# Patient Record
Sex: Female | Born: 1985 | Race: White | Hispanic: No | Marital: Single | Smoking: Never smoker
Health system: Southern US, Community
[De-identification: ages and names within clinical notes are randomized; demographics above are authoritative.]

## PROBLEM LIST (undated history)

## (undated) DIAGNOSIS — Z789 Other specified health status: Secondary | ICD-10-CM

## (undated) HISTORY — PX: NO PAST SURGERIES: SHX2092

## (undated) HISTORY — DX: Other specified health status: Z78.9

---

## 2011-02-17 NOTE — L&D Delivery Note (Signed)
Delivery Note At 6:20 AM a viable female was delivered via Vaginal, Spontaneous Delivery (Presentation: ; Left Occiput Anterior).  APGAR: 8, 9; weight 8 lb 11 oz (3940 g).   Placenta status: Intact, Spontaneous.  Cord: 3 vessels with the following complications: None.   Anesthesia: None  Episiotomy: None Lacerations: 1st degree perineal, hemostatic and well approximated, no repair needed Suture Repair: n/a Est. Blood Loss (mL): 250  Mom to postpartum.  Baby to nursery-stable.  Plans to breast/bottlefeed, desires Nexplanon.  Marge Duncans 10/25/2011, 7:36 AM

## 2011-07-29 ENCOUNTER — Encounter: Payer: Self-pay | Admitting: Family Medicine

## 2011-07-29 ENCOUNTER — Ambulatory Visit (INDEPENDENT_AMBULATORY_CARE_PROVIDER_SITE_OTHER): Payer: Self-pay | Admitting: Family Medicine

## 2011-07-29 VITALS — BP 95/61 | Temp 97.9°F | Wt 190.4 lb

## 2011-07-29 DIAGNOSIS — Z3490 Encounter for supervision of normal pregnancy, unspecified, unspecified trimester: Secondary | ICD-10-CM

## 2011-07-29 DIAGNOSIS — O0930 Supervision of pregnancy with insufficient antenatal care, unspecified trimester: Secondary | ICD-10-CM

## 2011-07-29 LAB — POCT URINALYSIS DIP (DEVICE)
Glucose, UA: NEGATIVE mg/dL
Hgb urine dipstick: NEGATIVE
Leukocytes, UA: NEGATIVE
Nitrite: NEGATIVE
Urobilinogen, UA: 0.2 mg/dL (ref 0.0–1.0)
pH: 6 (ref 5.0–8.0)

## 2011-07-29 NOTE — Progress Notes (Signed)
Pulse: 93 Pt is unsure of when her lmp was but went to health dept for upt and it is documented that lmp is 11/01/10, pt states that shes not sure if this is correct, but it may be. Has not had any ultrasounds. 1hr gtt today due at

## 2011-07-29 NOTE — Assessment & Plan Note (Signed)
Genetic Screen Late to care  Anatomic [place]   Glucose Screen   GBS   Feeding Preference Bottle  Contraception unsure  Circumcision

## 2011-07-29 NOTE — Progress Notes (Signed)
   Subjective:    Lindsay Tyler is a G2P1001 [redacted]w[redacted]d being seen today for her first obstetrical visit.  Her obstetrical history is significant for late to care. Patient does not intend to breast feed. Pregnancy history fully reviewed.  Patient reports no bleeding, no contractions, no cramping and no leaking.  Filed Vitals:   07/29/11 1038  BP: 95/61  Temp: 97.9 F (36.6 C)  Weight: 190 lb 6.4 oz (86.365 kg)    HISTORY: OB History    Grav Para Term Preterm Abortions TAB SAB Ect Mult Living   2 1 1  0 0 0 0 0 0 1     # Outc Date GA Lbr Len/2nd Wgt Sex Del Anes PTL Lv   1 TRM 2/09 [redacted]w[redacted]d   M SVD None No Yes   2 CUR              Past Medical History  Diagnosis Date  . No pertinent past medical history    Past Surgical History  Procedure Date  . No past surgeries    Family History  Problem Relation Age of Onset  . Diabetes Mother   . Hypertension Mother   . Heart disease Mother   . Cancer Mother     cervical     Exam    Uterus:     Pelvic Exam:    Perineum: No Hemorrhoids   Vulva: normal   Cervix: no cervical motion tenderness   Adnexa: not evaluated   Bony Pelvis: gynecoid  System:     Skin: normal coloration and turgor, no rashes    Neurologic: oriented, normal   Extremities: normal strength, tone, and muscle mass   HEENT PERRLA   Mouth/Teeth mucous membranes moist, pharynx normal without lesions   Neck supple and no masses   Cardiovascular: regular rate and rhythm, no murmurs or gallops   Respiratory:  appears well, vitals normal, no respiratory distress, acyanotic, normal RR, ear and throat exam is normal, neck free of mass or lymphadenopathy, chest clear, no wheezing, crepitations, rhonchi, normal symmetric air entry   Abdomen: soft, non-tender; bowel sounds normal; no masses,  no organomegaly   Urinary: urethral meatus normal      Assessment:    Pregnancy: G2P1001 Patient Active Problem List  Diagnosis  . Supervision of normal pregnancy        Plan:     Initial labs drawn. Prenatal vitamins. Problem list reviewed and updated. Genetic Screening - late to care  Ultrasound discussed; fetal survey: ordered.  Follow up in 1 weeks. 50% of 45 min visit spent on counseling and coordination of care.  GBS, gc/chlamydia done.  1hr GTT done ordered today.  As Pt [redacted] weeks EGA - will do pap at [facility] visit.   Lindsay Tyler 07/29/2011

## 2011-07-29 NOTE — Patient Instructions (Addendum)
Embarazo - Tercer trimestre (Pregnancy - Third Trimester) El tercer trimestre del embarazo (los ltimos 3 meses) es el perodo de cambios ms rpidos que atraviesan usted y el beb. El aumento de peso es ms rpido. El beb alcanza un largo de aproximadamente 50 cm (20 pulgadas) y pesa entre 2,700 y 4,500 kg (6 a 10 libras). El beb gana ms tejido graso y ya est listo para la vida fuera del cuerpo de la madre. Mientras estn en el interior, los bebs tienen perodos de sueo y vigilia, succionan el pulgar y tienen hipo. Quizs sienta pequeas contracciones del tero. Este es el falso trabajo de parto. Tambin se las conoce como contracciones de Braxton-Hicks. Es como una prctica del parto. Los problemas ms habituales de esta etapa del embarazo incluyen mayor dificultad para respirar, hinchazn de las manos y los pies por retencin de lquidos y la necesidad de orinar con ms frecuencia debido a que el tero y el beb presionan sobre la vejiga.  EXAMENES PRENATALES  Durante los exmenes prenatales, deber seguir realizando pruebas de sangre, segn avance el embarazo. Estas pruebas se realizan para controlar su salud y la del beb. Tambin se realizan anlisis de sangre para conocer los niveles de hemoglobina. La anemia (bajo nivel de hemoglobina) es frecuente durante el embarazo. Para prevenirla, se administran hierro y vitaminas. Tambin le harn nuevas pruebas para descartar la diabetes. Podrn repetirle algunas de las pruebas que le hicieron previamente.   En cada visita le medirn el tamao del tero. Es para asegurarse de que el beb se desarrolla correctamente.   Tambin en cada visita la pesarn. Esto se realiza para asegurarse de que aumenta de peso al ritmo indicado y que usted y su beb evolucionan normalmente.   En algunas ocasiones se realiza una ecografa para confirmar el correcto desarrollo y evolucin del beb. Esta prueba se realiza con ondas sonoras inofensivas para el beb, de modo  que el profesional pueda calcular con ms precisin la fecha del parto.   Discuta las posibilidades de la anestesia si necesita cesrea.  Algunas veces se realizan pruebas especializadas del lquido amnitico que rodea al beb. Esta prueba se denomina amniocentesis. El lquido amnitico se obtiene introduciendo una aguja en el abdomen (vientre). En ocasiones se lleva a cabo cerca del final del embarazo, si es necesario adelantar el parto. En este caso se realiza para asegurarse de que los pulmones del beb estn lo suficientemente maduros como para que pueda vivir fuera del tero. CAMBIOS QUE OCURREN EN EL TERCER TRIMESTRE DEL EMBARAZO Su organismo atravesar diferentes cambios durante el embarazo que varan de una persona a otra. Converse con el profesional que la asiste acerca los cambios que usted note y que la preocupen.  Durante el ltimo trimestre probablemente sienta un aumento del apetito. Es normal tener "antojos" de ciertas comidas. Esto vara de una persona a otra y de un embarazo a otro.   Podrn aparecer las primeras estras en las caderas, abdomen y mamas. Estos son cambios normales del cuerpo durante el embarazo. No existen medicamentos ni ejercicios que puedan prevenir estos cambios.   El estreimiento puede tratarse con un laxante o agregando fibra a su dieta. Beber grandes cantidades de lquidos, tomar fibras en forma de verduras, frutas y granos integrales es de gran ayuda.   Tambin es beneficioso practicar actividad fsica. Si ha sido una persona activa hasta el embarazo, podr continuar con la mayora de las actividades durante el mismo. Si ha sido menos activa, puede ser beneficioso   que comience con un programa de ejercicios, como realizar caminatas. Consulte con el profesional que la asiste antes de comenzar un programa de ejercicios.   Evite el consumo de cigarrillos, el alcohol, los medicamentos no prescritos y las "drogas de la calle" durante el embarazo. Estas sustancias  qumicas afectan la formacin y el desarrollo del beb. Evite estas sustancias durante todo el embarazo para asegurar el nacimiento de un beb sano.   Dolor de espalda, venas varicosas y hemorroides podran aparecer o empeorar.   Los movimientos del beb pueden ser ms bruscos y aparecer ms a menudo.   Puede que note dificultades para respirar facilmente.   El ombligo podra salrsele hacia afuera.   Puede segregar un lquido amarillento (calostro) de las mamas.   Puede segregar mucus con sangre. Esto normalmente ocurre unos pocos das a una semana antes de que comience el trabajo de parto.  INSTRUCCIONES PARA EL CUIDADO DOMICILIARIO  La mayor parte de los cuidados que se aconsejan son los mismos que los indicados para las primeras etapas del embarazo. Es importante que concurra a todas las citas con el profesional y siga sus instrucciones con respecto a los medicamentos que deba utilizar, a la actividad fsica y a la dieta.   Durante el embarazo debe obtener nutrientes para usted y para su beb. Consuma alimentos balanceados a intervalos regulares. Elija alimentos como carne, pescado, leche y otros productos lcteos descremados, verduras, frutas, panes integrales y cereales. El profesional le informar cul es el aumento de peso ideal.   Las relaciones sexuales pueden continuarse hasta casi el final del embarazo, si no se presentan otros problemas como prdida prematura (antes de tiempo) de lquido amnitico, hemorragia vaginal o dolor abdominal (en el vientre).   Realice actividad fsica todos los das, si no tiene restricciones. Consulte con el profesional que la asiste si no sabe con certeza si determinados ejercicios son seguros. El mayor aumento de peso se produce en los dos ltimos trimestres del embarazo.   Haga reposo con frecuencia, con las piernas elevadas, o segn lo necesite para evitar los calambres y el dolor de cintura.   Use un buen sostn o como los que se usan para hacer  deportes para aliviar la sensibilidad de las mamas. Tambin puede serle til si lo usa mientras duerme. Si pierde calostro, podr utilizar apsitos en el sostn.   No utilice la baera con agua caliente, baos turcos y saunas.   Colquese el cinturn de seguridad cuando conduzca. Este la proteger a usted y al beb en caso de accidente.   Evite comer carne cruda y el contacto con los utensilios y desperdicios de los gatos. Estos elementos contienen grmenes que pueden causar defectos de nacimiento en el beb.   Es fcil perder algo de orina durante el embarazo. Apretar y fortalecer los msculos de la pelvis la ayudar con este problema. Practique detener la miccin cuando est en el bao. Estos son los mismos msculos que necesita fortalecer. Son tambin los mismos msculos que utiliza cuando trata de evitar los gases. Puede practicar apretando estos msculos diez veces, y repetir esto tres veces por da aproximadamente. Una vez que conozca qu msculos debe contraer, no realice estos ejercicios durante la miccin. Puede favorecerle una infeccin si la orina vuelve hacia atrs.   Pida ayuda si tiene necesidades econmicas, de asesoramiento o nutricionales durante el embarazo. El profesional podr ayudarla con respecto a estas necesidades, o derivarla a otros especialistas.   Practique la ida hasta el hospital a modo   de prueba.   Tome clases prenatales junto con su pareja para comprender, practicar y hacer preguntas acerca del trabajo de parto y el nacimiento.   Prepare la habitacin del beb.   No viaje fuera de la ciudad a menos que sea absolutamente necesario y con el consejo del mdico.   Use slo zapatos bajos sin taco para tener un mejor equilibrio y prevenir cadas.  EL CONSUMO DE MEDICAMENTOS Y DROGAS DURANTE EL EMBARAZO  Contine tomando las vitaminas apropiadas para esta etapa tal como se le indic. Las vitaminas deben contener un miligramo de cido flico y deben suplementarse con  hierro. Guarde todas las vitaminas fuera del alcance de los nios. La ingestin de slo un par de vitaminas o comprimidos que contengan hierro pueden ocasionar la muerte en un beb o en un nio pequeo.   Evite el uso de medicamentos, inclusive los de venta libre, que no hayan sido prescritos o indicados por el profesional que la asiste. Algunos medicamentos pueden causar problemas fsicos al beb. Utilice los medicamentos de venta libre o de prescripcin para el dolor, el malestar o la fiebre, segn se lo indique el profesional que lo asiste. No utilice aspirina, ibuprofeno (Motrin, Advil, Nuprin) o naproxeno (Aleve) a menos que el profesional la autorice.   El alcohol se asocia a cierto nmero de defectos del nacimiento, incluido el sndrome de alcoholismo fetal. Debe evitar el consumo de alcohol en cualquiera de sus formas. El cigarrillo causa nacimientos prematuros y bebs de bajo peso al nacer. Las drogas de la calle son muy nocivas para el beb y estn absolutamente prohibidas. Un beb que nace de una madre adicta, ser adicto al nacer. Ese beb tendr los mismos sntomas de abstinencia que un adulto.   Infrmele al profesional si consume alguna droga.  SOLICITE ATENCIN MDICA SI: Tiene alguna preocupacin durante el embarazo. Es mejor que llame para formular las preguntas si no puede esperar hasta la prxima visita, que sentirse preocupada por ellas.  DECISIONES ACERCA DE LA CIRCUNCISIN Usted puede saber o no cul es el sexo de su beb. Si es un varn, ste es el momento de pensar acerca de la circuncisin. La circuncisin es la extirpacin del prepucio. Esta es la piel que cubre el extremo sensible del pene. No hay un motivo mdico que lo justifique. Generalmente la decisin se toma segn lo que sea popular en ese momento, o se basa en creencias religiosas. Podr conversar estos temas con el profesional que la asiste. SOLICITE ATENCIN MDICA DE INMEDIATO SI:  La temperatura oral se eleva  sin motivo por encima de 102 F (38.9 C) o segn le indique el profesional que la asiste.   Tiene una prdida de lquido por la vagina (canal de parto). Si sospecha una ruptura de las membranas, tmese la temperatura y llame al profesional para informarlo sobre esto.   Observa unas pequeas manchas, una hemorragia vaginal o elimina cogulos. Avsele al profesional acerca de la cantidad y de cuntos apsitos est utilizando.   Presenta un olor desagradable en la secrecin vaginal y observa un cambio en el color, de transparente a blanco.   Ha vomitado durante ms de 24 horas.   Presenta escalofros o fiebre.   Comienza a sentir falta de aire.   Siente ardor al orinar.   [place] o sube ms de 900 g (ms de 2 libras), o segn lo indicado por el profesional que la asiste. Observa que sbitamente se le hinchan el rostro, las manos, los pies o las   piernas.   Presenta dolor abdominal. Las molestias en el ligamento redondo son una causa benigna (no cancerosa) frecuente de dolor abdominal durante el embarazo, pero el profesional que la asiste deber evaluarlo.   Presenta dolor de cabeza intenso que no se alivia.   Si no siente los movimientos del beb durante ms de tres horas. Si piensa que el beb no se mueve tanto como lo haca habitualmente, coma algo que contenga azcar y recustese sobre el lado izquierdo durante una hora. El beb debe moverse al menos 4  5 veces por hora. Comunquese inmediatamente si el beb se mueve menos que lo indicado.   Se cae, se ve involucrada en un accidente automovilstico o sufre algn tipo de traumatismo.   En su hogar hay violencia mental o fsica.  Document Released: 11/12/2004 Document Revised: 01/22/2011 [employer] Patient Information 2012 [employer]. 

## 2011-07-30 LAB — OBSTETRIC PANEL
Eosinophils Absolute: 0 10*3/uL (ref 0.0–0.7)
Hemoglobin: 12.2 g/dL (ref 12.0–15.0)
Hepatitis B Surface Ag: NEGATIVE
Lymphs Abs: 1.4 10*3/uL (ref 0.7–4.0)
MCH: 29.3 pg (ref 26.0–34.0)
Monocytes Relative: 6 % (ref 3–12)
Neutro Abs: 6.2 10*3/uL (ref 1.7–7.7)
Neutrophils Relative %: 77 % (ref 43–77)
Platelets: 301 10*3/uL (ref 150–400)
RBC: 4.17 MIL/uL (ref 3.87–5.11)
WBC: 8.2 10*3/uL (ref 4.0–10.5)

## 2011-07-30 LAB — GLUCOSE TOLERANCE, 1 HOUR: Glucose, 1 Hour GTT: 109 mg/dL (ref 70–140)

## 2011-07-31 LAB — CULTURE, OB URINE
Colony Count: NO GROWTH
Organism ID, Bacteria: NO GROWTH

## 2011-08-04 ENCOUNTER — Ambulatory Visit (HOSPITAL_COMMUNITY)
Admission: RE | Admit: 2011-08-04 | Discharge: 2011-08-04 | Disposition: A | Discharge status: Home / Self Care | Payer: Self-pay | Source: Ambulatory Visit | Attending: Family Medicine | Admitting: Family Medicine

## 2011-08-04 DIAGNOSIS — Z1389 Encounter for screening for other disorder: Secondary | ICD-10-CM | POA: Insufficient documentation

## 2011-08-04 DIAGNOSIS — O358XX0 Maternal care for other (suspected) fetal abnormality and damage, not applicable or unspecified: Secondary | ICD-10-CM | POA: Insufficient documentation

## 2011-08-04 DIAGNOSIS — Z3490 Encounter for supervision of normal pregnancy, unspecified, unspecified trimester: Secondary | ICD-10-CM

## 2011-08-04 DIAGNOSIS — Z363 Encounter for antenatal screening for malformations: Secondary | ICD-10-CM | POA: Insufficient documentation

## 2011-08-05 ENCOUNTER — Ambulatory Visit (INDEPENDENT_AMBULATORY_CARE_PROVIDER_SITE_OTHER): Payer: Self-pay | Admitting: Advanced Practice Midwife

## 2011-08-05 VITALS — BP 94/55 | Temp 98.2°F | Wt 192.6 lb

## 2011-08-05 DIAGNOSIS — Z3490 Encounter for supervision of normal pregnancy, unspecified, unspecified trimester: Secondary | ICD-10-CM

## 2011-08-05 DIAGNOSIS — O0930 Supervision of pregnancy with insufficient antenatal care, unspecified trimester: Secondary | ICD-10-CM

## 2011-08-05 LAB — HEMOGLOBINOPATHY EVALUATION
Hemoglobin Other: 0 %
Hgb A2 Quant: 2.9 % (ref 2.2–3.2)
Hgb F Quant: 0 % (ref 0.0–2.0)

## 2011-08-05 LAB — POCT URINALYSIS DIP (DEVICE)
Bilirubin Urine: NEGATIVE
Ketones, ur: NEGATIVE mg/dL

## 2011-08-05 NOTE — Progress Notes (Signed)
Reviewed [place] from yesterday. Measurements show she was really 30.0 weeks yesterday. Pt is disappointed she has to wait longer. Reviewed lab results.

## 2011-08-05 NOTE — Patient Instructions (Addendum)
Embarazo - Tercer trimestre (Pregnancy - Third Trimester) El tercer trimestre del embarazo (los ltimos 3 meses) es el perodo de cambios ms rpidos que atraviesan usted y el beb. El aumento de peso es ms rpido. El beb alcanza un largo de aproximadamente 50 cm (20 pulgadas) y pesa entre 2,700 y 4,500 kg (6 a 10 libras). El beb gana ms tejido graso y ya est listo para la vida fuera del cuerpo de la madre. Mientras estn en el interior, los bebs tienen perodos de sueo y vigilia, succionan el pulgar y tienen hipo. Quizs sienta pequeas contracciones del tero. Este es el falso trabajo de parto. Tambin se las conoce como contracciones de Braxton-Hicks. Es como una prctica del parto. Los problemas ms habituales de esta etapa del embarazo incluyen mayor dificultad para respirar, hinchazn de las manos y los pies por retencin de lquidos y la necesidad de orinar con ms frecuencia debido a que el tero y el beb presionan sobre la vejiga.  EXAMENES PRENATALES  Durante los exmenes prenatales, deber seguir realizando pruebas de sangre, segn avance el embarazo. Estas pruebas se realizan para controlar su salud y la del beb. Tambin se realizan anlisis de sangre para conocer los niveles de hemoglobina. La anemia (bajo nivel de hemoglobina) es frecuente durante el embarazo. Para prevenirla, se administran hierro y vitaminas. Tambin le harn nuevas pruebas para descartar la diabetes. Podrn repetirle algunas de las pruebas que le hicieron previamente.   En cada visita le medirn el tamao del tero. Es para asegurarse de que el beb se desarrolla correctamente.   Tambin en cada visita la pesarn. Esto se realiza para asegurarse de que aumenta de peso al ritmo indicado y que usted y su beb evolucionan normalmente.   En algunas ocasiones se realiza una ecografa para confirmar el correcto desarrollo y evolucin del beb. Esta prueba se realiza con ondas sonoras inofensivas para el beb, de modo  que el profesional pueda calcular con ms precisin la fecha del parto.   Discuta las posibilidades de la anestesia si necesita cesrea.  Algunas veces se realizan pruebas especializadas del lquido amnitico que rodea al beb. Esta prueba se denomina amniocentesis. El lquido amnitico se obtiene introduciendo una aguja en el abdomen (vientre). En ocasiones se lleva a cabo cerca del final del embarazo, si es necesario adelantar el parto. En este caso se realiza para asegurarse de que los pulmones del beb estn lo suficientemente maduros como para que pueda vivir fuera del tero. CAMBIOS QUE OCURREN EN EL TERCER TRIMESTRE DEL EMBARAZO Su organismo atravesar diferentes cambios durante el embarazo que varan de una persona a otra. Converse con el profesional que la asiste acerca los cambios que usted note y que la preocupen.  Durante el ltimo trimestre probablemente sienta un aumento del apetito. Es normal tener "antojos" de ciertas comidas. Esto vara de una persona a otra y de un embarazo a otro.   Podrn aparecer las primeras estras en las caderas, abdomen y mamas. Estos son cambios normales del cuerpo durante el embarazo. No existen medicamentos ni ejercicios que puedan prevenir estos cambios.   El estreimiento puede tratarse con un laxante o agregando fibra a su dieta. Beber grandes cantidades de lquidos, tomar fibras en forma de verduras, frutas y granos integrales es de gran ayuda.   Tambin es beneficioso practicar actividad fsica. Si ha sido una persona activa hasta el embarazo, podr continuar con la mayora de las actividades durante el mismo. Si ha sido menos activa, puede ser beneficioso   que comience con un programa de ejercicios, como Pension scheme manager. Consulte con el profesional que la asiste antes de comenzar un programa de ejercicios.   Evite el consumo de cigarrillos, el alcohol, los medicamentos no prescritos y las "drogas de la calle" durante el [place]. Estas sustancias  qumicas afectan la formacin y el desarrollo del beb. Evite estas sustancias durante todo el embarazo para asegurar el nacimiento de un beb sano.   Dolor de espalda, venas varicosas y hemorroides podran aparecer o empeorar.   Los movimientos del beb pueden ser ms bruscos y aparecer ms a menudo.   Puede que note dificultades para respirar facilmente.   El ombligo podra salrsele hacia afuera.   Puede segregar un lquido amarillento (calostro) de las Universal.   Puede segregar mucus con sangre. Esto normalmente ocurre unos pocos das a una semana antes de que comience el [place].  [place] parte de los cuidados que se aconsejan son los mismos que los indicados para las primeras etapas del Media planner. Es importante que concurra a todas las citas con el profesional y siga sus instrucciones con Engineer, site a los medicamentos que deba Risk manager, a la actividad fsica y a Engineer, technical sales.   Durante el embarazo debe obtener nutrientes para usted y para su beb. Consuma alimentos balanceados a intervalos regulares. Elija alimentos como carne, pescado, [place] y otros productos lcteos descremados, verduras, frutas, panes integrales y cereales. El Conservation officer, nature cul es el aumento de peso ideal.   Las relaciones sexuales pueden continuarse hasta casi el final del embarazo, si no se presentan otros problemas como prdida prematura (antes de tiempo) de lquido amnitico, hemorragia vaginal o dolor abdominal (en el vientre).   [place], si no tiene restricciones. Consulte con el profesional que la asiste si no sabe con certeza si determinados ejercicios son seguros. El mayor aumento de peso se produce [place] ltimos trimestres del [place].   Haga reposo con frecuencia, con las piernas elevadas, o segn lo necesite para evitar los calambres y el dolor de cintura.   Use un buen sostn o como los que se usan para hacer  deportes para Public house manager la sensibilidad de las [place]. Tambin puede serle til si lo [place] mientras duerme. Si pierde Adult nurse, podr [employer].   No utilice la baera con agua caliente, baos turcos y saunas.   Colquese el cinturn de seguridad cuando conduzca. Este la proteger a usted y al beb en caso de accidente.   Evite comer carne cruda y el contacto con los utensilios y desperdicios de los gatos. Estos elementos contienen grmenes que pueden causar defectos de nacimiento en el beb.   Es fcil perder algo de orina durante el [place]. Apretar y Software engineer los msculos de la pelvis la ayudar con este problema. Practique detener la miccin cuando est en el bao. Estos son los mismos msculos que Advertising copywriter. Son [employer] mismos msculos que utiliza cuando trata de [employer] gases. Puede practicar apretando estos msculos [employer], y repetir esto tres veces por da aproximadamente. Una vez que conozca qu msculos debe contraer, no realice estos ejercicios durante la miccin. Puede favorecerle una infeccin si la orina vuelve hacia atrs.   Pida ayuda si tiene necesidades econmicas, de asesoramiento o nutricionales durante el [place]. El profesional podr ayudarla con respecto a estas necesidades, o derivarla a otros especialistas.   Practique la ida [employer] hospital a modo  de prueba.   Tome clases prenatales junto con su pareja para comprender, practicar y hacer preguntas acerca del [place] de parto y el nacimiento.   Prepare la habitacin del beb.   No viaje fuera de la ciudad a menos que sea absolutamente necesario y con el consejo del mdico.   Use slo zapatos bajos sin taco para tener un mejor equilibrio y prevenir cadas.  EL CONSUMO DE MEDICAMENTOS Y DROGAS DURANTE EL EMBARAZO  Contine tomando las vitaminas apropiadas para esta etapa tal como se le indic. Las vitaminas deben contener un miligramo de cido flico y deben suplementarse con  hierro. Guarde todas las vitaminas fuera del alcance de los nios. La ingestin de slo un par de vitaminas o comprimidos que contengan hierro pueden ocasionar la [employer] en un beb o en un nio pequeo.   Evite el uso de [place], inclusive los de venta [place], que no hayan sido prescritos o indicados por el profesional que la asiste. Algunos medicamentos pueden causar problemas fsicos al beb. Utilice los medicamentos de venta libre o de prescripcin para Conservation officer, historic buildings, Health and safety inspector o la [place], segn se lo indique el profesional que lo asiste. No utilice aspirina, ibuprofeno (Motrin, Advil, Nuprin) o naproxeno (Aleve) a menos que el profesional la autorice.   El alcohol se asocia a cierto nmero de defectos del nacimiento, incluido el sndrome de alcoholismo fetal. Debe evitar el consumo de alcohol en cualquiera de sus formas. El cigarrillo causa nacimientos prematuros y bebs de bajo peso al nacer. Las drogas de la calle son muy nocivas para el beb y estn absolutamente prohibidas. Un beb que nace de [employer], ser adicto al nacer. Ese beb tendr los mismos sntomas de abstinencia que un adulto.   Infrmele al profesional si consume alguna droga.  SOLICITE ATENCIN MDICA SI: Tiene alguna preocupacin Solicitor. Es mejor que llame para formular las preguntas si no puede esperar hasta la prxima visita, que sentirse preocupada por ellas.  [place] o no cul es el sexo de su beb. Si es un varn, ste es el momento de pensar acerca de la circuncisin. La circuncisin es la extirpacin del prepucio. Esta es la piel que cubre el extremo sensible del pene. No hay un motivo mdico que lo justifique. Generalmente la decisin se toma segn lo que sea popular en ese momento, o se basa en creencias religiosas. Podr conversar estos temas con el profesional que la asiste. SOLICITE ATENCIN MDICA DE INMEDIATO SI:  La temperatura oral se eleva  sin motivo por encima de 5 F (38.9 C) o segn le indique el profesional que la asiste.   Tiene una prdida de lquido por la vagina (canal de parto). Si sospecha una ruptura de las [place], tmese la temperatura y llame al profesional para informarlo sobre esto.   Observa unas pequeas manchas, una hemorragia vaginal o elimina cogulos. Avsele al profesional acerca de la cantidad y de cuntos apsitos est utilizando.   Presenta un olor desagradable en la secrecin vaginal y observa un cambio en el color, de transparente a blanco.   Ha vomitado durante ms de 24 horas.   Presenta escalofros o fiebre.   Comienza a sentir falta de [place].   Siente ardor al [place].   [place] o sube ms de 900 g (ms de 2 libras), o segn lo indicado por el profesional que la asiste. Observa que sbitamente se le [employer], las manos, los pies o las  piernas.   Presenta dolor abdominal. Las molestias en el ligamento redondo son Neomia Dear causa benigna (no cancerosa) frecuente de Engineer, mining abdominal durante el Psychiatrist, pero el profesional que la asiste deber evaluarlo.   Presenta dolor de cabeza intenso que no se [place].   Si no siente los movimientos del beb durante ms de tres horas. Si piensa que el beb no se mueve tanto como lo haca habitualmente, coma algo que Psychologist, clinical y [employer] lado izquierdo durante [place]. El beb debe moverse al menos 4  5 veces por hora. Comunquese inmediatamente si el beb se mueve menos que lo indicado.   Se cae, se ve involucrada en un accidente automovilstico o sufre algn tipo de traumatismo.   En su hogar hay violencia mental o fsica.  Document Released: 11/12/2004 Document Revised: 01/22/2011 [facility] Patient Information 2012 [place], [place].  Eleccin del mtodo anticonceptivo  (Contraception Choices) La anticoncepcin (control de la natalidad) es el uso de cualquier mtodo o dispositivo para Location manager. A continuacin se indican  algunos de esos mtodos.  MTODOS HORMONALES   Implante anticonceptivo. Es un tubo plstico delgado que contiene la hormona progesterona. No contiene estrgenos. El mdico inserta el tubo en la parte interna del brazo. El tubo puede Geneticist, molecular durante 3 aos. Despus de los 3 aos debe retirarse. El implante impide que los ovarios liberen vulos (ovulacin), espesa el moco cervical, lo que evita que los espermatozoides ingresen al tero y hace ms delgada la membrana que cubre el interior del tero.   Inyecciones de progesterona sola. Estas inyecciones se administran cada 3 meses para evitar el embarazo. La progesterona sinttica impide que los ovarios liberen vulos. Tambin hace que el moco cervical se espese y modifica el recubrimiento interno del tero. Esto hace ms difcil que los espermatozoides sobrevivan en el tero.   Pldoras anticonceptivas. Las pldoras anticonceptivas contienen estrgenos y Education officer, museum. Actan impidiendo que el vulo se forme en el ovario(ovulacin). Las pldoras anticonceptivas son recetadas por el mdico.Tambin se utilizan para tratar los perodos menstruales abundantes.   Minipldora. Este tipo de pldora anticonceptiva contiene slo hormona progesterona. Deben tomarse todos los [address]  [address] del mes y debe recetarlas el mdico.   Parches anticonceptivos. El parche contiene hormonas similares a las que contienen las pldoras anticonceptivas. Deben cambiarse una vez por semana y se utilizan bajo prescripcin mdica.   Anillo vaginal. Anillo vaginal contiene hormonas similares a las que contienen las pldoras anticonceptivas. Se deja colocado durante tres semanas, se lo retira durante 1 semana y luego se coloca uno nuevo. La paciente debe sentirse cmoda para insertar y retirar el anillo de la vagina.Es necesaria la receta del mdico.   Anticonceptivos de Associate Professor. Los anticonceptivos de emergencia son mtodos para evitar un embarazo despus de una relacin  sexual sin proteccin. Esta pldora puede tomarse inmediatamente despus de Child psychotherapist sexuales o hasta 5 [place] de haber tenido sexo sin proteccin. Es ms efectiva si se toma poco tiempo despus. Los anticonceptivos de emergencia estn disponibles sin prescripcin mdica. Consltelo con su farmacutico. No use los anticonceptivos de emergencia como nico mtodo anticonceptivo.  MTODOS DE BARRERA   Condn masculino. Es una vaina delgada (ltex o goma) que se [place] en el pene durante el acto sexual. Deri Fuelling con espermicida para aumentar la efectividad.   Condn femenino. Es una vaina blanda y floja que se adapta suavemente a la vagina antes de las relaciones sexuales.   Diafragma. Es Neomia Dear barrera de ltex redonda y [place]  que debe ser ajustada por un profesional. Se inserta en la vagina, junto con un gel espermicida. Debe insertarse antes de Management consultant. Debe dejar el diafragma colocado en la vagina durante 6 a 8 horas despus de la relacin sexual.   Capuchn cervical. Es una taza de ltex o plstico, redonda y [place] que cubre el cuello del tero y debe ser ajustada por un mdico. Puede dejarlo colocado en la vagina hasta 48 horas despus de las Clinical research associate.   Esponja. Es una pieza blanda y circular de espuma de poliuretano. Contiene un espermicida. Se inserta en la vagina despus de mojarla y antes de las [employer].   Espermicidas. Los espermicidas son qumicos que matan o bloquean el esperma y no lo dejan ingresar al cuello del tero y al tero. Vienen en forma de cremas, geles, supositorios, espuma o comprimidos. No es necesario tener Emergency planning/management officer. Se insertan en la vagina con un aplicador antes de Management consultant. El proceso debe repetirse cada vez que tiene relaciones sexuales.  ANTICONCEPTIVOS INTRAUTERINOS   Dispositivo intrauterino (DIU). Es un dispositivo en forma de T que se coloca en el tero durante el perodo menstrual, para Pharmacist, hospital. Hay dos tipos:   DIU de cobre. Este tipo de DIU est recubierto con un alambre de cobre y se inserta dentro del tero. El cobre hace que el tero y las trompas de Falopio produzcan un liquido que [employer] espermatozoides. Puede permanecer colocado durante 10 aos.   DIU hormonal. Este tipo de DIU contiene la hormona progestina (progesterona sinttica). La hormona espesa el moco cervical y evita que los espermatozoides ingresen al tero y tambin afina la membrana que cubre el tero para evitar la implantacin del vulo fertilizado. La hormona debilita o destruye los espermatozoides que ingresan al tero. Puede permanecer colocado durante 5 aos.  MTODOS ANTICONCEPTIVOS PERMANENTES   Ligadura de trompas en la mujer. La ligadura de trompas en la mujer se realiza sellando, atando u obstruyendo quirrgicamente las trompas de [place] lo que impide que el vulo descienda hacia el tero.   Esterilizacin masculina. Se realiza atando los conductos por los que pasan los espermatozoides (vasectoma).Esto impide que el esperma ingrese a la vagina durante el acto sexual. Luego del procedimiento, el hombre puede eyacular lquido (semen).  MTODOS DE PLANIFICACIN NATURAL   Planificacin familiar natural.  Consiste en no tener relaciones sexuales o usar un mtodo de barrera (condn, [place], capuchn cervical) en los [employer] la mujer podra quedar [place].   Mtodo calendario.  Consiste en el seguimiento de la duracin de cada ciclo menstrual y la identificacin de los perodos frtiles.   Mtodo de Occupational hygienist.  Consiste en evitar las relaciones sexuales durante la ovulacin.   Mtodo sintotrmico. Paramedic las relaciones sexuales en la poca en la que se est ovulando, utilizando un termmetro y tendiendo en cuenta los sntomas de la ovulacin.   Mtodo post-ovulacin. Consiste en planificar las relaciones sexuales para despus de haber ovulado.  Independientemente del tipo  o mtodo anticonceptivo que usted elija, es importante que use condones para protegerse contra las enfermedades de transmisin sexual (ETS). Hable con su mdico con respecto a qu mtodo anticonceptivo es el ms apropiado para usted.  Document Released: 02/02/2005 Document Revised: 01/22/2011 [facility] Patient Information 2012 [place], [place].

## 2011-08-05 NOTE — Progress Notes (Signed)
Pulse- 92   Edema- feet   Pain-lower abd

## 2011-08-11 ENCOUNTER — Encounter: Payer: Self-pay | Admitting: Advanced Practice Midwife

## 2011-08-11 ENCOUNTER — Encounter: Payer: Self-pay | Admitting: Family Medicine

## 2011-08-19 ENCOUNTER — Encounter: Payer: Self-pay | Admitting: *Deleted

## 2011-08-19 ENCOUNTER — Ambulatory Visit (INDEPENDENT_AMBULATORY_CARE_PROVIDER_SITE_OTHER): Payer: Self-pay | Admitting: Physician Assistant

## 2011-08-19 VITALS — BP 106/62 | Temp 97.1°F | Wt 192.9 lb

## 2011-08-19 DIAGNOSIS — Z3490 Encounter for supervision of normal pregnancy, unspecified, unspecified trimester: Secondary | ICD-10-CM

## 2011-08-19 DIAGNOSIS — N898 Other specified noninflammatory disorders of vagina: Secondary | ICD-10-CM

## 2011-08-19 DIAGNOSIS — O0930 Supervision of pregnancy with insufficient antenatal care, unspecified trimester: Secondary | ICD-10-CM

## 2011-08-19 LAB — POCT URINALYSIS DIP (DEVICE)
Protein, ur: NEGATIVE mg/dL
Urobilinogen, UA: 0.2 mg/dL (ref 0.0–1.0)

## 2011-08-19 NOTE — Progress Notes (Signed)
C/o gray/green vag d/c since prior to preg. Wet prep sent. + FM no s/s PTL reviewed new dating based on [place]. NL Glucola

## 2011-08-19 NOTE — Addendum Note (Signed)
Addended by: Doreen Salvage on: 08/19/2011 09:03 AM   Modules accepted: Orders

## 2011-08-19 NOTE — Patient Instructions (Signed)
Embarazo - Tercer trimestre (Pregnancy - Third Trimester) El tercer trimestre del embarazo (los ltimos 3 meses) es el perodo de cambios ms rpidos que atraviesan usted y el beb. El aumento de peso es ms rpido. El beb alcanza un largo de aproximadamente 50 cm (20 pulgadas) y pesa entre 2,700 y 4,500 kg (6 a 10 libras). El beb gana ms tejido graso y ya est listo para la vida fuera del cuerpo de la madre. Mientras estn en el interior, los bebs tienen perodos de sueo y vigilia, succionan el pulgar y tienen hipo. Quizs sienta pequeas contracciones del tero. Este es el falso trabajo de parto. Tambin se las conoce como contracciones de Braxton-Hicks. Es como una prctica del parto. Los problemas ms habituales de esta etapa del embarazo incluyen mayor dificultad para respirar, hinchazn de las manos y los pies por retencin de lquidos y la necesidad de orinar con ms frecuencia debido a que el tero y el beb presionan sobre la vejiga.  EXAMENES PRENATALES  Durante los exmenes prenatales, deber seguir realizando pruebas de sangre, segn avance el embarazo. Estas pruebas se realizan para controlar su salud y la del beb. Tambin se realizan anlisis de sangre para conocer los niveles de hemoglobina. La anemia (bajo nivel de hemoglobina) es frecuente durante el embarazo. Para prevenirla, se administran hierro y vitaminas. Tambin le harn nuevas pruebas para descartar la diabetes. Podrn repetirle algunas de las pruebas que le hicieron previamente.   En cada visita le medirn el tamao del tero. Es para asegurarse de que el beb se desarrolla correctamente.   Tambin en cada visita la pesarn. Esto se realiza para asegurarse de que aumenta de peso al ritmo indicado y que usted y su beb evolucionan normalmente.   En algunas ocasiones se realiza una ecografa para confirmar el correcto desarrollo y evolucin del beb. Esta prueba se realiza con ondas sonoras inofensivas para el beb, de modo  que el profesional pueda calcular con ms precisin la fecha del parto.   Discuta las posibilidades de la anestesia si necesita cesrea.  Algunas veces se realizan pruebas especializadas del lquido amnitico que rodea al beb. Esta prueba se denomina amniocentesis. El lquido amnitico se obtiene introduciendo una aguja en el abdomen (vientre). En ocasiones se lleva a cabo cerca del final del embarazo, si es necesario adelantar el parto. En este caso se realiza para asegurarse de que los pulmones del beb estn lo suficientemente maduros como para que pueda vivir fuera del tero. CAMBIOS QUE OCURREN EN EL TERCER TRIMESTRE DEL EMBARAZO Su organismo atravesar diferentes cambios durante el embarazo que varan de una persona a otra. Converse con el profesional que la asiste acerca los cambios que usted note y que la preocupen.  Durante el ltimo trimestre probablemente sienta un aumento del apetito. Es normal tener "antojos" de ciertas comidas. Esto vara de una persona a otra y de un embarazo a otro.   Podrn aparecer las primeras estras en las caderas, abdomen y mamas. Estos son cambios normales del cuerpo durante el embarazo. No existen medicamentos ni ejercicios que puedan prevenir estos cambios.   El estreimiento puede tratarse con un laxante o agregando fibra a su dieta. Beber grandes cantidades de lquidos, tomar fibras en forma de verduras, frutas y granos integrales es de gran ayuda.   Tambin es beneficioso practicar actividad fsica. Si ha sido una persona activa hasta el embarazo, podr continuar con la mayora de las actividades durante el mismo. Si ha sido menos activa, puede ser beneficioso   que comience con un programa de ejercicios, como realizar caminatas. Consulte con el profesional que la asiste antes de comenzar un programa de ejercicios.   Evite el consumo de cigarrillos, el alcohol, los medicamentos no prescritos y las "drogas de la calle" durante el embarazo. Estas sustancias  qumicas afectan la formacin y el desarrollo del beb. Evite estas sustancias durante todo el embarazo para asegurar el nacimiento de un beb sano.   Dolor de espalda, venas varicosas y hemorroides podran aparecer o empeorar.   Los movimientos del beb pueden ser ms bruscos y aparecer ms a menudo.   Puede que note dificultades para respirar facilmente.   El ombligo podra salrsele hacia afuera.   Puede segregar un lquido amarillento (calostro) de las mamas.   Puede segregar mucus con sangre. Esto normalmente ocurre unos pocos das a una semana antes de que comience el trabajo de parto.  INSTRUCCIONES PARA EL CUIDADO DOMICILIARIO  La mayor parte de los cuidados que se aconsejan son los mismos que los indicados para las primeras etapas del embarazo. Es importante que concurra a todas las citas con el profesional y siga sus instrucciones con respecto a los medicamentos que deba utilizar, a la actividad fsica y a la dieta.   Durante el embarazo debe obtener nutrientes para usted y para su beb. Consuma alimentos balanceados a intervalos regulares. Elija alimentos como carne, pescado, leche y otros productos lcteos descremados, verduras, frutas, panes integrales y cereales. El profesional le informar cul es el aumento de peso ideal.   Las relaciones sexuales pueden continuarse hasta casi el final del embarazo, si no se presentan otros problemas como prdida prematura (antes de tiempo) de lquido amnitico, hemorragia vaginal o dolor abdominal (en el vientre).   Realice actividad fsica todos los das, si no tiene restricciones. Consulte con el profesional que la asiste si no sabe con certeza si determinados ejercicios son seguros. El mayor aumento de peso se produce en los dos ltimos trimestres del embarazo.   Haga reposo con frecuencia, con las piernas elevadas, o segn lo necesite para evitar los calambres y el dolor de cintura.   Use un buen sostn o como los que se usan para hacer  deportes para aliviar la sensibilidad de las mamas. Tambin puede serle til si lo usa mientras duerme. Si pierde calostro, podr utilizar apsitos en el sostn.   No utilice la baera con agua caliente, baos turcos y saunas.   Colquese el cinturn de seguridad cuando conduzca. Este la proteger a usted y al beb en caso de accidente.   Evite comer carne cruda y el contacto con los utensilios y desperdicios de los gatos. Estos elementos contienen grmenes que pueden causar defectos de nacimiento en el beb.   Es fcil perder algo de orina durante el embarazo. Apretar y fortalecer los msculos de la pelvis la ayudar con este problema. Practique detener la miccin cuando est en el bao. Estos son los mismos msculos que necesita fortalecer. Son tambin los mismos msculos que utiliza cuando trata de evitar los gases. Puede practicar apretando estos msculos diez veces, y repetir esto tres veces por da aproximadamente. Una vez que conozca qu msculos debe contraer, no realice estos ejercicios durante la miccin. Puede favorecerle una infeccin si la orina vuelve hacia atrs.   Pida ayuda si tiene necesidades econmicas, de asesoramiento o nutricionales durante el embarazo. El profesional podr ayudarla con respecto a estas necesidades, o derivarla a otros especialistas.   Practique la ida hasta el hospital a modo   de prueba.   Tome clases prenatales junto con su pareja para comprender, practicar y hacer preguntas acerca del trabajo de parto y el nacimiento.   Prepare la habitacin del beb.   No viaje fuera de la ciudad a menos que sea absolutamente necesario y con el consejo del mdico.   Use slo zapatos bajos sin taco para tener un mejor equilibrio y prevenir cadas.  EL CONSUMO DE MEDICAMENTOS Y DROGAS DURANTE EL EMBARAZO  Contine tomando las vitaminas apropiadas para esta etapa tal como se le indic. Las vitaminas deben contener un miligramo de cido flico y deben suplementarse con  hierro. Guarde todas las vitaminas fuera del alcance de los nios. La ingestin de slo un par de vitaminas o comprimidos que contengan hierro pueden ocasionar la muerte en un beb o en un nio pequeo.   Evite el uso de medicamentos, inclusive los de venta libre, que no hayan sido prescritos o indicados por el profesional que la asiste. Algunos medicamentos pueden causar problemas fsicos al beb. Utilice los medicamentos de venta libre o de prescripcin para el dolor, el malestar o la fiebre, segn se lo indique el profesional que lo asiste. No utilice aspirina, ibuprofeno (Motrin, Advil, Nuprin) o naproxeno (Aleve) a menos que el profesional la autorice.   El alcohol se asocia a cierto nmero de defectos del nacimiento, incluido el sndrome de alcoholismo fetal. Debe evitar el consumo de alcohol en cualquiera de sus formas. El cigarrillo causa nacimientos prematuros y bebs de bajo peso al nacer. Las drogas de la calle son muy nocivas para el beb y estn absolutamente prohibidas. Un beb que nace de una madre adicta, ser adicto al nacer. Ese beb tendr los mismos sntomas de abstinencia que un adulto.   Infrmele al profesional si consume alguna droga.  SOLICITE ATENCIN MDICA SI: Tiene alguna preocupacin durante el embarazo. Es mejor que llame para formular las preguntas si no puede esperar hasta la prxima visita, que sentirse preocupada por ellas.  DECISIONES ACERCA DE LA CIRCUNCISIN Usted puede saber o no cul es el sexo de su beb. Si es un varn, ste es el momento de pensar acerca de la circuncisin. La circuncisin es la extirpacin del prepucio. Esta es la piel que cubre el extremo sensible del pene. No hay un motivo mdico que lo justifique. Generalmente la decisin se toma segn lo que sea popular en ese momento, o se basa en creencias religiosas. Podr conversar estos temas con el profesional que la asiste. SOLICITE ATENCIN MDICA DE INMEDIATO SI:  La temperatura oral se eleva  sin motivo por encima de 102 F (38.9 C) o segn le indique el profesional que la asiste.   Tiene una prdida de lquido por la vagina (canal de parto). Si sospecha una ruptura de las membranas, tmese la temperatura y llame al profesional para informarlo sobre esto.   Observa unas pequeas manchas, una hemorragia vaginal o elimina cogulos. Avsele al profesional acerca de la cantidad y de cuntos apsitos est utilizando.   Presenta un olor desagradable en la secrecin vaginal y observa un cambio en el color, de transparente a blanco.   Ha vomitado durante ms de 24 horas.   Presenta escalofros o fiebre.   Comienza a sentir falta de aire.   Siente ardor al orinar.   Baja o sube ms de 900 g (ms de 2 libras), o segn lo indicado por el profesional que la asiste. Observa que sbitamente se le hinchan el rostro, las manos, los pies o las   piernas.   Presenta dolor abdominal. Las molestias en el ligamento redondo son una causa benigna (no cancerosa) frecuente de dolor abdominal durante el embarazo, pero el profesional que la asiste deber evaluarlo.   Presenta dolor de cabeza intenso que no se alivia.   Si no siente los movimientos del beb durante ms de tres horas. Si piensa que el beb no se mueve tanto como lo haca habitualmente, coma algo que contenga azcar y recustese sobre el lado izquierdo durante una hora. El beb debe moverse al menos 4  5 veces por hora. Comunquese inmediatamente si el beb se mueve menos que lo indicado.   Se cae, se ve involucrada en un accidente automovilstico o sufre algn tipo de traumatismo.   En su hogar hay violencia mental o fsica.  Document Released: 11/12/2004 Document Revised: 01/22/2011 [employer] Patient Information 2012 [employer]. 

## 2011-08-19 NOTE — Progress Notes (Signed)
Pulse 91. Edema traces in feet. No c/o pain; no pressure. C/o vaginal d/c- grey in color with odor; no itch.

## 2011-08-20 ENCOUNTER — Other Ambulatory Visit: Payer: Self-pay | Admitting: Physician Assistant

## 2011-08-20 LAB — WET PREP, GENITAL: Yeast Wet Prep HPF POC: NONE SEEN

## 2011-08-20 MED ORDER — METRONIDAZOLE 500 MG PO TABS: 500.0000 mg | ORAL_TABLET | Freq: Two times a day (BID) | ORAL | Status: AC

## 2011-08-26 ENCOUNTER — Telehealth: Payer: Self-pay | Admitting: *Deleted

## 2011-08-26 NOTE — Telephone Encounter (Signed)
Called patient using [place] interpreter patients phone went straight to voicemail, left her a message requesting a callback.

## 2011-08-26 NOTE — Telephone Encounter (Signed)
Message copied by Mannie Stabile on Wed Aug 26, 2011  8:19 AM ------      Message from: Maylon Cos E      Created: Thu Aug 20, 2011 10:29 AM       Please notify pt of results. Rx flagyl sent to pharmacy

## 2011-08-27 NOTE — Telephone Encounter (Signed)
Called pt w/[employer] interpreter and left message that pt has medication prescribed for a vaginal infection. She should go to her pharmacy and pick up the medication.  Dosage instructions given.

## 2011-08-27 NOTE — Telephone Encounter (Signed)
Called pt with Spanish interpreter Raynelle Fanning and left message to return our call to the clinics.

## 2011-09-09 ENCOUNTER — Ambulatory Visit (INDEPENDENT_AMBULATORY_CARE_PROVIDER_SITE_OTHER): Payer: Self-pay | Admitting: Advanced Practice Midwife

## 2011-09-09 VITALS — BP 102/66 | Temp 97.1°F | Ht 61.25 in | Wt 196.0 lb

## 2011-09-09 DIAGNOSIS — O0930 Supervision of pregnancy with insufficient antenatal care, unspecified trimester: Secondary | ICD-10-CM

## 2011-09-09 LAB — POCT URINALYSIS DIP (DEVICE)
Glucose, UA: NEGATIVE mg/dL
Nitrite: NEGATIVE
Urobilinogen, UA: 0.2 mg/dL (ref 0.0–1.0)

## 2011-09-09 MED ORDER — GUAIFENESIN ER 600 MG PO TB12: 1200.0000 mg | ORAL_TABLET | Freq: Two times a day (BID) | ORAL | Status: DC

## 2011-09-09 MED ORDER — PSEUDOEPHEDRINE HCL 60 MG PO TABS: 60.0000 mg | ORAL_TABLET | ORAL | Status: AC | PRN

## 2011-09-09 NOTE — Patient Instructions (Signed)
Embarazo - Tercer trimestre (Pregnancy - Third Trimester) El tercer trimestre del embarazo (los ltimos 3 meses) es el perodo de cambios ms rpidos que atraviesan usted y el beb. El aumento de peso es ms rpido. El beb alcanza un largo de aproximadamente 50 cm (20 pulgadas) y pesa entre 2,700 y 4,500 kg (6 a 10 libras). El beb gana ms tejido graso y ya est listo para la vida fuera del cuerpo de la madre. Mientras estn en el interior, los bebs tienen perodos de sueo y vigilia, succionan el pulgar y tienen hipo. Quizs sienta pequeas contracciones del tero. Este es el falso trabajo de parto. Tambin se las conoce como contracciones de Braxton-Hicks. Es como una prctica del parto. Los problemas ms habituales de esta etapa del embarazo incluyen mayor dificultad para respirar, hinchazn de las manos y los pies por retencin de lquidos y la necesidad de orinar con ms frecuencia debido a que el tero y el beb presionan sobre la vejiga.  EXAMENES PRENATALES  Durante los exmenes prenatales, deber seguir realizando pruebas de sangre, segn avance el embarazo. Estas pruebas se realizan para controlar su salud y la del beb. Tambin se realizan anlisis de sangre para conocer los niveles de hemoglobina. La anemia (bajo nivel de hemoglobina) es frecuente durante el embarazo. Para prevenirla, se administran hierro y vitaminas. Tambin le harn nuevas pruebas para descartar la diabetes. Podrn repetirle algunas de las pruebas que le hicieron previamente.   En cada visita le medirn el tamao del tero. Es para asegurarse de que el beb se desarrolla correctamente.   Tambin en cada visita la pesarn. Esto se realiza para asegurarse de que aumenta de peso al ritmo indicado y que usted y su beb evolucionan normalmente.   En algunas ocasiones se realiza una ecografa para confirmar el correcto desarrollo y evolucin del beb. Esta prueba se realiza con ondas sonoras inofensivas para el beb, de modo  que el profesional pueda calcular con ms precisin la fecha del parto.   Discuta las posibilidades de la anestesia si necesita cesrea.  Algunas veces se realizan pruebas especializadas del lquido amnitico que rodea al beb. Esta prueba se denomina amniocentesis. El lquido amnitico se obtiene introduciendo una aguja en el abdomen (vientre). En ocasiones se lleva a cabo cerca del final del embarazo, si es necesario adelantar el parto. En este caso se realiza para asegurarse de que los pulmones del beb estn lo suficientemente maduros como para que pueda vivir fuera del tero. CAMBIOS QUE OCURREN EN EL TERCER TRIMESTRE DEL EMBARAZO Su organismo atravesar diferentes cambios durante el embarazo que varan de una persona a otra. Converse con el profesional que la asiste acerca los cambios que usted note y que la preocupen.  Durante el ltimo trimestre probablemente sienta un aumento del apetito. Es normal tener "antojos" de ciertas comidas. Esto vara de una persona a otra y de un embarazo a otro.   Podrn aparecer las primeras estras en las caderas, abdomen y mamas. Estos son cambios normales del cuerpo durante el embarazo. No existen medicamentos ni ejercicios que puedan prevenir estos cambios.   El estreimiento puede tratarse con un laxante o agregando fibra a su dieta. Beber grandes cantidades de lquidos, tomar fibras en forma de verduras, frutas y granos integrales es de gran ayuda.   Tambin es beneficioso practicar actividad fsica. Si ha sido una persona activa hasta el embarazo, podr continuar con la mayora de las actividades durante el mismo. Si ha sido menos activa, puede ser beneficioso   que comience con un programa de ejercicios, como realizar caminatas. Consulte con el profesional que la asiste antes de comenzar un programa de ejercicios.   Evite el consumo de cigarrillos, el alcohol, los medicamentos no prescritos y las "drogas de la calle" durante el embarazo. Estas sustancias  qumicas afectan la formacin y el desarrollo del beb. Evite estas sustancias durante todo el embarazo para asegurar el nacimiento de un beb sano.   Dolor de espalda, venas varicosas y hemorroides podran aparecer o empeorar.   Los movimientos del beb pueden ser ms bruscos y aparecer ms a menudo.   Puede que note dificultades para respirar facilmente.   El ombligo podra salrsele hacia afuera.   Puede segregar un lquido amarillento (calostro) de las mamas.   Puede segregar mucus con sangre. Esto normalmente ocurre unos pocos das a una semana antes de que comience el trabajo de parto.  INSTRUCCIONES PARA EL CUIDADO DOMICILIARIO  La mayor parte de los cuidados que se aconsejan son los mismos que los indicados para las primeras etapas del embarazo. Es importante que concurra a todas las citas con el profesional y siga sus instrucciones con respecto a los medicamentos que deba utilizar, a la actividad fsica y a la dieta.   Durante el embarazo debe obtener nutrientes para usted y para su beb. Consuma alimentos balanceados a intervalos regulares. Elija alimentos como carne, pescado, leche y otros productos lcteos descremados, verduras, frutas, panes integrales y cereales. El profesional le informar cul es el aumento de peso ideal.   Las relaciones sexuales pueden continuarse hasta casi el final del embarazo, si no se presentan otros problemas como prdida prematura (antes de tiempo) de lquido amnitico, hemorragia vaginal o dolor abdominal (en el vientre).   Realice actividad fsica todos los das, si no tiene restricciones. Consulte con el profesional que la asiste si no sabe con certeza si determinados ejercicios son seguros. El mayor aumento de peso se produce en los dos ltimos trimestres del embarazo.   Haga reposo con frecuencia, con las piernas elevadas, o segn lo necesite para evitar los calambres y el dolor de cintura.   Use un buen sostn o como los que se usan para hacer  deportes para aliviar la sensibilidad de las mamas. Tambin puede serle til si lo usa mientras duerme. Si pierde calostro, podr utilizar apsitos en el sostn.   No utilice la baera con agua caliente, baos turcos y saunas.   Colquese el cinturn de seguridad cuando conduzca. Este la proteger a usted y al beb en caso de accidente.   Evite comer carne cruda y el contacto con los utensilios y desperdicios de los gatos. Estos elementos contienen grmenes que pueden causar defectos de nacimiento en el beb.   Es fcil perder algo de orina durante el embarazo. Apretar y fortalecer los msculos de la pelvis la ayudar con este problema. Practique detener la miccin cuando est en el bao. Estos son los mismos msculos que necesita fortalecer. Son tambin los mismos msculos que utiliza cuando trata de evitar los gases. Puede practicar apretando estos msculos diez veces, y repetir esto tres veces por da aproximadamente. Una vez que conozca qu msculos debe contraer, no realice estos ejercicios durante la miccin. Puede favorecerle una infeccin si la orina vuelve hacia atrs.   Pida ayuda si tiene necesidades econmicas, de asesoramiento o nutricionales durante el embarazo. El profesional podr ayudarla con respecto a estas necesidades, o derivarla a otros especialistas.   Practique la ida hasta el hospital a modo   de prueba.   Tome clases prenatales junto con su pareja para comprender, practicar y hacer preguntas acerca del trabajo de parto y el nacimiento.   Prepare la habitacin del beb.   No viaje fuera de la ciudad a menos que sea absolutamente necesario y con el consejo del mdico.   Use slo zapatos bajos sin taco para tener un mejor equilibrio y prevenir cadas.  EL CONSUMO DE MEDICAMENTOS Y DROGAS DURANTE EL EMBARAZO  Contine tomando las vitaminas apropiadas para esta etapa tal como se le indic. Las vitaminas deben contener un miligramo de cido flico y deben suplementarse con  hierro. Guarde todas las vitaminas fuera del alcance de los nios. La ingestin de slo un par de vitaminas o comprimidos que contengan hierro pueden ocasionar la muerte en un beb o en un nio pequeo.   Evite el uso de medicamentos, inclusive los de venta libre, que no hayan sido prescritos o indicados por el profesional que la asiste. Algunos medicamentos pueden causar problemas fsicos al beb. Utilice los medicamentos de venta libre o de prescripcin para el dolor, el malestar o la fiebre, segn se lo indique el profesional que lo asiste. No utilice aspirina, ibuprofeno (Motrin, Advil, Nuprin) o naproxeno (Aleve) a menos que el profesional la autorice.   El alcohol se asocia a cierto nmero de defectos del nacimiento, incluido el sndrome de alcoholismo fetal. Debe evitar el consumo de alcohol en cualquiera de sus formas. El cigarrillo causa nacimientos prematuros y bebs de bajo peso al nacer. Las drogas de la calle son muy nocivas para el beb y estn absolutamente prohibidas. Un beb que nace de una madre adicta, ser adicto al nacer. Ese beb tendr los mismos sntomas de abstinencia que un adulto.   Infrmele al profesional si consume alguna droga.  SOLICITE ATENCIN MDICA SI: Tiene alguna preocupacin durante el embarazo. Es mejor que llame para formular las preguntas si no puede esperar hasta la prxima visita, que sentirse preocupada por ellas.  DECISIONES ACERCA DE LA CIRCUNCISIN Usted puede saber o no cul es el sexo de su beb. Si es un varn, ste es el momento de pensar acerca de la circuncisin. La circuncisin es la extirpacin del prepucio. Esta es la piel que cubre el extremo sensible del pene. No hay un motivo mdico que lo justifique. Generalmente la decisin se toma segn lo que sea popular en ese momento, o se basa en creencias religiosas. Podr conversar estos temas con el profesional que la asiste. SOLICITE ATENCIN MDICA DE INMEDIATO SI:  La temperatura oral se eleva  sin motivo por encima de 102 F (38.9 C) o segn le indique el profesional que la asiste.   Tiene una prdida de lquido por la vagina (canal de parto). Si sospecha una ruptura de las membranas, tmese la temperatura y llame al profesional para informarlo sobre esto.   Observa unas pequeas manchas, una hemorragia vaginal o elimina cogulos. Avsele al profesional acerca de la cantidad y de cuntos apsitos est utilizando.   Presenta un olor desagradable en la secrecin vaginal y observa un cambio en el color, de transparente a blanco.   Ha vomitado durante ms de 24 horas.   Presenta escalofros o fiebre.   Comienza a sentir falta de aire.   Siente ardor al orinar.   Baja o sube ms de 900 g (ms de 2 libras), o segn lo indicado por el profesional que la asiste. Observa que sbitamente se le hinchan el rostro, las manos, los pies o las   piernas.   Presenta dolor abdominal. Las molestias en el ligamento redondo son una causa benigna (no cancerosa) frecuente de dolor abdominal durante el embarazo, pero el profesional que la asiste deber evaluarlo.   Presenta dolor de cabeza intenso que no se alivia.   Si no siente los movimientos del beb durante ms de tres horas. Si piensa que el beb no se mueve tanto como lo haca habitualmente, coma algo que contenga azcar y recustese sobre el lado izquierdo durante una hora. El beb debe moverse al menos 4  5 veces por hora. Comunquese inmediatamente si el beb se mueve menos que lo indicado.   Se cae, se ve involucrada en un accidente automovilstico o sufre algn tipo de traumatismo.   En su hogar hay violencia mental o fsica.  Document Released: 11/12/2004 Document Revised: 01/22/2011 [employer] Patient Information 2012 [employer]. 

## 2011-09-09 NOTE — Progress Notes (Signed)
Pulse 89.  Edema trace in feet. C/o pain in lower back and lower abdomen; no pressure. Vaginal d/c stated as thin white; odor present, no itch. Stated did not take Flagyl because of dosage concerns.

## 2011-09-09 NOTE — Progress Notes (Signed)
C/o pain in lower back that radiates low and around to her pelvis. The pain started 2 weeks ago and is constant. No contractions, bleeding, or rush of fluid. Feels baby moving. Does have a discharge that has an odor to it. No vaginal itching. She was given flagyl 500mg  BID x 7 days but has not taken it because she is afraid it will hurt the baby. Trace protein today but BP normal and pt denies headaches or vision changes. She has had a cold for ~2 weeks and wonders if she can take any medicine for it. Gave rx for Mucinex and Sudafed. She can take Tylenol for the lower back pain. Pt reassured that she can safely take flagyl.

## 2011-09-09 NOTE — Progress Notes (Signed)
Pt seen and I agree with note. Will Rx Sudafed and Mucinex and have her use Tylenol for pain.

## 2011-09-11 ENCOUNTER — Encounter: Payer: Self-pay | Admitting: Advanced Practice Midwife

## 2011-09-16 ENCOUNTER — Ambulatory Visit (INDEPENDENT_AMBULATORY_CARE_PROVIDER_SITE_OTHER): Payer: Self-pay | Admitting: Family

## 2011-09-16 VITALS — BP 96/64 | Temp 97.1°F | Wt 198.5 lb

## 2011-09-16 DIAGNOSIS — O0930 Supervision of pregnancy with insufficient antenatal care, unspecified trimester: Secondary | ICD-10-CM

## 2011-09-16 LAB — POCT URINALYSIS DIP (DEVICE)
Bilirubin Urine: NEGATIVE
Glucose, UA: NEGATIVE mg/dL
Leukocytes, UA: NEGATIVE
Nitrite: NEGATIVE
pH: 6 (ref 5.0–8.0)

## 2011-09-16 LAB — OB RESULTS CONSOLE GBS: GBS: NEGATIVE

## 2011-09-16 NOTE — Progress Notes (Signed)
Pulse 88 Has swelling in her feet

## 2011-09-16 NOTE — Addendum Note (Signed)
Addended by: Freddi Starr on: 09/16/2011 10:20 AM   Modules accepted: Orders

## 2011-09-16 NOTE — Progress Notes (Signed)
No questions or concerns; GBS and GC/CT collected today; provided reassurance about feet swelling, elevate when possible.

## 2011-09-19 LAB — CULTURE, BETA STREP (GROUP B ONLY)

## 2011-09-20 ENCOUNTER — Encounter: Payer: Self-pay | Admitting: Family

## 2011-09-23 ENCOUNTER — Ambulatory Visit (INDEPENDENT_AMBULATORY_CARE_PROVIDER_SITE_OTHER): Payer: Self-pay | Admitting: Advanced Practice Midwife

## 2011-09-23 ENCOUNTER — Encounter: Payer: Self-pay | Admitting: Advanced Practice Midwife

## 2011-09-23 VITALS — BP 96/65 | Temp 97.7°F | Wt 200.2 lb

## 2011-09-23 DIAGNOSIS — O0930 Supervision of pregnancy with insufficient antenatal care, unspecified trimester: Secondary | ICD-10-CM

## 2011-09-23 LAB — POCT URINALYSIS DIP (DEVICE)
Protein, ur: NEGATIVE mg/dL
Specific Gravity, Urine: 1.01 (ref 1.005–1.030)
Urobilinogen, UA: 0.2 mg/dL (ref 0.0–1.0)

## 2011-09-23 NOTE — Progress Notes (Signed)
Doing well. Wanted to know results of testing>informed all negative.  Labor precautions reviewed. Reviewed PP care.

## 2011-09-23 NOTE — Patient Instructions (Signed)
Trabajo de parto y parto normal (Normal Labor and Delivery) En primer lugar, su mdico debe estar seguro de que usted est en trabajo de parto. Algunos signos son:  Puede haber eliminado el "tapn mucoso" antes que comience el trabajo de parto. Se trata de una pequea cantidad de mucus con sangre.   Tiene contracciones uterinas regulares.   El tiempo entre las contracciones se acorta.   Las molestias y el dolor se hacen gradualmente ms intensos.   El dolor se ubica principalmente en la espalda.   Los dolores empeoran al caminar.   El cuello del tero (la apertura del tero se hace ms delgada, comienza a borrarse, y se abre (se dilata).  Una vez que se encuentre en trabajo de parto y sea admitida en el hospital, el mdico har lo siguiente:  Un examen fsico completo.   Controlar sus signos vitales (presin arterial, pulso, temperatura y la frecuencia cardaca fetal).   Realizar un examen vaginal (usando un guante estril y lubricante para determinar:   La posicin (presentacin) del beb (ceflica [vertex] o nalgas primero).   El nivel (plano) de la cabeza del beb en el canal de parto.   El borramiento y dilatacin del cuello del tero.   Le rasurarn el vello pbico y le aplicarn una enema segn lo considere el mdico y las circunstancias.   Generalmente se coloca un monitor electrnico sobre el abdomen. El monitor sigue la duracin e intensidad de las contracciones, as como la frecuencia cardaca del beb.   Generalmente, el profesional inserta una va intravenosa en el brazo para administrarle agua azucarada. Esta es una medida de precaucin, de modo que puedan administrarle rpidamente medicamentos durante el trabajo de parto.  EL TRABAJO DE PARTO Y PARTO NORMALES SE DIVIDEN EN 3 ETAPAS: Primera etapa Comienzan las contracciones regulares y el cuello comienza a borrarse y dilatarse. Esta etapa puede durar entre 3 y 15 horas. El final de la primera etapa se considera  cuando el cuello est borrado en un 100% y se ha dilatado 10 cm. Le administrarn analgsicos por:  Inyeccin (morfina, demerol, etc.).   Anestesia regional (espinal, caudal o epidural, anestsicos colocados en diferentes regiones de la columna vertebral). Podrn administrarle medicamentos para el dolor en la regin paracervical, que consiste en la aplicacin de un anestsico inyectable en cada uno de los lados del cuello del tero.  La embarazada puede requerir un "parto natural" , es decir no recibir medicamentos o anestesia durante el trabajo de parto y el parto. Segunda etapa En este momento el beb baja a travs del canal de parto (vagina) y nace. Esto puede durar entre 1 y 4 horas. A medida que el beb asoma la cabeza por el canal de parto, podr sentir una sensacin similar a cuando mueve el intestino. Sentir el impulse de empujar con fuerza hasta que el nio salga. A medida que la cabecita baja, el mdico decidir si realiza una episiotoma (corte en el perineo y rea de la vagina) para evitar la ruptura de los tejidos). Luego del nacimiento del beb y la expulsin de la placenta, la episiotoma se sutura. En algunos casos se coloca a la madre una mscara con xido nitroso para facilitar la respiracin y aliviar el dolor. El final de la etapa 2 se produce cuando el beb ha salido completamente. Luego, cuando el cordn umbilical deja de pulsar, se pinza y se corta. Tercera etapa La tercera etapa comienza luego que el beb ha nacido y finaliza luego de la   expulsin de la placenta. Generalmente esto lleva entre 5 y 30 minutos. Luego de la expulsin de la placenta, le aplicarn un medicamento por va intravenosa para ayudar a contraer el tero y prevenir hemorragias. En la tercera etapa no hay dolor y generalmente no son necesarios los analgsicos. Si le han realizado una episiotoma, es el momento de repararla. Luego del parto, la mam es observada y controlada exhaustivamente durante 1  2 horas  para verificar que no hay sangrado en el post parto (hemorragias). Si pierde mucha sangre, le administrarn un medicamento para contraer el tero y detener la hemorragia. Document Released: 01/16/2008 Document Revised: 01/22/2011 [employer] Patient Information 2012 [employer]. 

## 2011-09-23 NOTE — Progress Notes (Signed)
Pulse 83 Patient reports some back and vaginal pressure and clear d/c Patient states she needs refill on PNV

## 2011-09-30 ENCOUNTER — Encounter: Payer: Self-pay | Admitting: Family

## 2011-10-07 ENCOUNTER — Encounter: Payer: Self-pay | Admitting: Obstetrics and Gynecology

## 2011-10-07 ENCOUNTER — Ambulatory Visit (INDEPENDENT_AMBULATORY_CARE_PROVIDER_SITE_OTHER): Payer: Self-pay | Admitting: Obstetrics and Gynecology

## 2011-10-07 VITALS — BP 101/67 | Temp 97.3°F | Wt 202.9 lb

## 2011-10-07 DIAGNOSIS — O0930 Supervision of pregnancy with insufficient antenatal care, unspecified trimester: Secondary | ICD-10-CM

## 2011-10-07 LAB — POCT URINALYSIS DIP (DEVICE)
Glucose, UA: NEGATIVE mg/dL
Hgb urine dipstick: NEGATIVE
Nitrite: NEGATIVE
Protein, ur: NEGATIVE mg/dL
Urobilinogen, UA: 0.2 mg/dL (ref 0.0–1.0)
pH: 6.5 (ref 5.0–8.0)

## 2011-10-07 NOTE — Progress Notes (Signed)
P = 83 Thin, clear discharge

## 2011-10-07 NOTE — Patient Instructions (Addendum)
Trabajo de parto y parto normal (Normal Labor and Delivery) En primer lugar, su mdico debe estar seguro de que usted est en trabajo de parto. Algunos signos son:  Puede haber eliminado el "tapn mucoso" antes que comience el trabajo de parto. Se trata de una pequea cantidad de mucus con sangre.   Tiene contracciones uterinas regulares.   El tiempo entre las contracciones se acorta.   Las molestias y el dolor se hacen gradualmente ms intensos.   El dolor se ubica principalmente en la espalda.   Los dolores empeoran al caminar.   El cuello del tero (la apertura del tero se hace ms delgada, comienza a borrarse, y se abre (se dilata).  Una vez que se encuentre en trabajo de parto y sea admitida en el hospital, el mdico har lo siguiente:  Un examen fsico completo.   Controlar sus signos vitales (presin arterial, pulso, temperatura y la frecuencia cardaca fetal).   Realizar un examen vaginal (usando un guante estril y lubricante para determinar:   La posicin (presentacin) del beb (ceflica [vertex] o nalgas primero).   El nivel (plano) de la cabeza del beb en el canal de parto.   El borramiento y dilatacin del cuello del tero.   Le rasurarn el vello pbico y le aplicarn una enema segn lo considere el mdico y las circunstancias.   Generalmente se coloca un monitor electrnico sobre el abdomen. El monitor sigue la duracin e intensidad de las contracciones, as como la frecuencia cardaca del beb.   Generalmente, el profesional inserta una va intravenosa en el brazo para administrarle agua azucarada. Esta es una medida de precaucin, de modo que puedan administrarle rpidamente medicamentos durante el trabajo de parto.  EL TRABAJO DE PARTO Y PARTO NORMALES SE DIVIDEN EN 3 ETAPAS: Primera etapa Comienzan las contracciones regulares y el cuello comienza a borrarse y dilatarse. Esta etapa puede durar entre 3 y 15 horas. El final de la primera etapa se considera  cuando el cuello est borrado en un 100% y se ha dilatado 10 cm. Le administrarn analgsicos por:  Inyeccin (morfina, demerol, etc.).   Anestesia regional (espinal, caudal o epidural, anestsicos colocados en diferentes regiones de la columna vertebral). Podrn administrarle medicamentos para el dolor en la regin paracervical, que consiste en la aplicacin de un anestsico inyectable en cada uno de los lados del cuello del tero.  La embarazada puede requerir un "parto natural" , es decir no recibir medicamentos o anestesia durante el trabajo de parto y el parto. Segunda etapa En este momento el beb baja a travs del canal de parto (vagina) y nace. Esto puede durar entre 1 y 4 horas. A medida que el beb asoma la cabeza por el canal de parto, podr sentir una sensacin similar a cuando mueve el intestino. Sentir el impulse de empujar con fuerza hasta que el nio salga. A medida que la cabecita baja, el mdico decidir si realiza una episiotoma (corte en el perineo y rea de la vagina) para evitar la ruptura de los tejidos). Luego del nacimiento del beb y la expulsin de la placenta, la episiotoma se sutura. En algunos casos se coloca a la madre una mscara con xido nitroso para facilitar la respiracin y aliviar el dolor. El final de la etapa 2 se produce cuando el beb ha salido completamente. Luego, cuando el cordn umbilical deja de pulsar, se pinza y se corta. Tercera etapa La tercera etapa comienza luego que el beb ha nacido y finaliza luego de la   expulsin de la placenta. Generalmente esto lleva entre 5 y 30 minutos. Luego de la expulsin de la placenta, le aplicarn un medicamento por va intravenosa para ayudar a contraer el tero y prevenir hemorragias. En la tercera etapa no hay dolor y generalmente no son necesarios los analgsicos. Si le han realizado una episiotoma, es el momento de repararla. Luego del parto, la mam es observada y controlada exhaustivamente durante 1  2 horas  para verificar que no hay sangrado en el post parto (hemorragias). Si pierde mucha sangre, le administrarn un medicamento para contraer el tero y detener la hemorragia. Document Released: 01/16/2008 Document Revised: 01/22/2011 [employer] Patient Information 2012 [employer]. 

## 2011-10-07 NOTE — Progress Notes (Signed)
Doing well.  Thin, clear discharge, no gush of fluid.  No bleeding.  Labor precautions reviewed.  Reviewed PP care.   Discussed options for contraception, given information about nexplanon.

## 2011-10-07 NOTE — Progress Notes (Signed)
Saw pt and agree wiith PA note. S/Sx labor.

## 2011-10-14 ENCOUNTER — Encounter: Payer: Self-pay | Admitting: Obstetrics and Gynecology

## 2011-10-14 ENCOUNTER — Ambulatory Visit (INDEPENDENT_AMBULATORY_CARE_PROVIDER_SITE_OTHER): Payer: Self-pay | Admitting: Obstetrics and Gynecology

## 2011-10-14 DIAGNOSIS — O0930 Supervision of pregnancy with insufficient antenatal care, unspecified trimester: Secondary | ICD-10-CM

## 2011-10-14 LAB — POCT URINALYSIS DIP (DEVICE)
Bilirubin Urine: NEGATIVE
Nitrite: NEGATIVE
Protein, ur: NEGATIVE mg/dL
Urobilinogen, UA: 0.2 mg/dL (ref 0.0–1.0)
pH: 6 (ref 5.0–8.0)

## 2011-10-14 NOTE — Patient Instructions (Signed)
Mtodo para contar los movimientos fetales (Fetal Movement Counts) Nombre de la paciente: __________________________________________________ Fecha probable de parto:____________________ En los embarazos de alto riesgo se recomienda contar las pataditas, pero tambin es una buena idea que lo hagan todas las embarazadas. Comience a contarlas a las 28 semanas de embarazo. Los movimientos fetales aumentan luego de una comida completa o de comer o beber algo dulce (el nivel de azcar en la sangre est ms alto). Tambin es importante beber gran cantidad de lquidos (hidratarse bien) antes de contar. Si se recuesta sobre el lado izquierdo mejorar la circulacin, o puede sentarse en una silla cmoda con los brazos sobre el abdomen y sin distracciones que la rodeen. CONTANDO  Trate de contar a la misma hora todos los das que lo haga.   Marque el da y la hora y vea cunto le lleva sentir 10 movimientos (patadas, agitaciones, sacudones, vueltas). Debe sentir al menos 10 movimientos en 2 horas. Probablemente sienta los 10 movimientos en menos de dos horas. Si no los siente, espere una hora y cuente nuevamente. Luego de algunos das tendr un patrn.   Debemos observar si hay cambios en el patrn o no hay suficientes pataditas en 2 horas. Le lleva ms tiempo contar los 10 movimientos?  SOLICITE ATENCIN MDICA SI:  Siente menos de 10 pataditas en 2 horas. Intntelo dos veces.   No siente movimientos durante 1 hora.   El patrn se modifica o le lleva ms tiempo cada da contar las 10 pataditas.   Siente que el beb no se mueve como lo hace habitualmente.  Fecha: ____________ Movimientos: ____________ Comienzo hora: ____________ Fin hora: ____________ Fecha: ____________ Movimientos: ____________ Comienzo hora: ____________ Fin hora: ____________ Fecha: ____________ Movimientos: ____________ Comienzo hora: ____________ Fin hora: ____________ Fecha: ____________ Movimientos: ____________ Comienzo hora:  ____________ Fin hora: ____________ Fecha: ____________ Movimientos: ____________ Comienzo hora: ____________ Fin hora: ____________ Fecha: ____________ Movimientos: ____________ Comienzo hora: ____________ Fin hora: ____________ Fecha: ____________ Movimientos: ____________ Comienzo hora: ____________ Fin hora: ____________  Fecha: ____________ Movimientos: ____________ Comienzo hora: ____________ Fin hora: ____________ Fecha: ____________ Movimientos: ____________ Comienzo hora: ____________ Fin hora: ____________ Fecha: ____________ Movimientos: ____________ Comienzo hora: ____________ Fin hora: ____________ Fecha: ____________ Movimientos: ____________ Comienzo hora: ____________ Fin hora: ____________ Fecha: ____________ Movimientos: ____________ Comienzo hora: ____________ Fin hora: ____________ Fecha: ____________ Movimientos: ____________ Comienzo hora: ____________ Fin hora: ____________ Fecha: ____________ Movimientos: ____________ Comienzo hora: ____________ Fin hora: ____________  Fecha: ____________ Movimientos: ____________ Comienzo hora: ____________ Fin hora: ____________ Fecha: ____________ Movimientos: ____________ Comienzo hora: ____________ Fin hora: ____________ Fecha: ____________ Movimientos: ____________ Comienzo hora: ____________ Fin hora: ____________ Fecha: ____________ Movimientos: ____________ Comienzo hora: ____________ Fin hora: ____________ Fecha: ____________ Movimientos: ____________ Comienzo hora: ____________ Fin hora: ____________ Fecha: ____________ Movimientos: ____________ Comienzo hora: ____________ Fin hora: ____________ Fecha: ____________ Movimientos: ____________ Comienzo hora: ____________ Fin hora: ____________  Fecha: ____________ Movimientos: ____________ Comienzo hora: ____________ Fin hora: ____________ Fecha: ____________ Movimientos: ____________ Comienzo hora: ____________ Fin hora: ____________ Fecha: ____________ Movimientos: ____________  Comienzo hora: ____________ Fin hora: ____________ Fecha: ____________ Movimientos: ____________ Comienzo hora: ____________ Fin hora: ____________ Fecha: ____________ Movimientos: ____________ Comienzo hora: ____________ Fin hora: ____________ Fecha: ____________ Movimientos: ____________ Comienzo hora: ____________ Fin hora: ____________ Fecha: ____________ Movimientos: ____________ Comienzo hora: ____________ Fin hora: ____________  Fecha: ____________ Movimientos: ____________ Comienzo hora: ____________ Fin hora: ____________ Fecha: ____________ Movimientos: ____________ Comienzo hora: ____________ Fin hora: ____________ Fecha: ____________ Movimientos: ____________ Comienzo hora: ____________ Fin hora: ____________ Fecha: ____________ Movimientos: ____________ Comienzo hora: ____________ Fin hora: ____________ Fecha: ____________ Movimientos:   ____________ Comienzo hora: ____________ Fin hora: ____________ Fecha: ____________ Movimientos: ____________ Comienzo hora: ____________ Fin hora: ____________ Fecha: ____________ Movimientos: ____________ Comienzo hora: ____________ Fin hora: ____________  Fecha: ____________ Movimientos: ____________ Comienzo hora: ____________ Fin hora: ____________ Fecha: ____________ Movimientos: ____________ Comienzo hora: ____________ Fin hora: ____________ Fecha: ____________ Movimientos: ____________ Comienzo hora: ____________ Fin hora: ____________ Fecha: ____________ Movimientos: ____________ Comienzo hora: ____________ Fin hora: ____________ Fecha: ____________ Movimientos: ____________ Comienzo hora: ____________ Fin hora: ____________ Fecha: ____________ Movimientos: ____________ Comienzo hora: ____________ Fin hora: ____________ Fecha: ____________ Movimientos: ____________ Comienzo hora: ____________ Fin hora: ____________  Fecha: ____________ Movimientos: ____________ Comienzo hora: ____________ Fin hora: ____________ Fecha: ____________ Movimientos:  ____________ Comienzo hora: ____________ Fin hora: ____________ Fecha: ____________ Movimientos: ____________ Comienzo hora: ____________ Fin hora: ____________ Fecha: ____________ Movimientos: ____________ Comienzo hora: ____________ Fin hora: ____________ Fecha: ____________ Movimientos: ____________ Comienzo hora: ____________ Fin hora: ____________ Fecha: ____________ Movimientos: ____________ Comienzo hora: ____________ Fin hora: ____________ Fecha: ____________ Movimientos: ____________ Comienzo hora: ____________ Fin hora: ____________  Fecha: ____________ Movimientos: ____________ Comienzo hora: ____________ Fin hora: ____________ Fecha: ____________ Movimientos: ____________ Comienzo hora: ____________ Fin hora: ____________ Fecha: ____________ Movimientos: ____________ Comienzo hora: ____________ Fin hora: ____________ Fecha: ____________ Movimientos: ____________ Comienzo hora: ____________ Fin hora: ____________ Fecha: ____________ Movimientos: ____________ Comienzo hora: ____________ Fin hora: ____________ Fecha: ____________ Movimientos: ____________ Comienzo hora: ____________ Fin hora: ____________ Fecha: ____________ Movimientos: ____________ Comienzo hora: ____________ Fin hora: ____________  Document Released: 05/12/2007 Document Revised: 01/22/2011 [employer] Patient Information 2012 [employer]. 

## 2011-10-14 NOTE — Progress Notes (Signed)
Dated by 30 wk scan so will start FATs in 2 days. S/SX labor and kick counts

## 2011-10-14 NOTE — Progress Notes (Signed)
P=87, c/o edema in feet, hands states"I feel like my whole body is swelling". Used Interpreter Dorita,.c/o pain in buttock area and pelvis

## 2011-10-16 ENCOUNTER — Ambulatory Visit (INDEPENDENT_AMBULATORY_CARE_PROVIDER_SITE_OTHER): Payer: Self-pay | Admitting: *Deleted

## 2011-10-16 VITALS — BP 94/67 | Wt 205.5 lb

## 2011-10-16 DIAGNOSIS — O480 Post-term pregnancy: Secondary | ICD-10-CM

## 2011-10-16 NOTE — Progress Notes (Signed)
P - 81 

## 2011-10-16 NOTE — Progress Notes (Signed)
NST reviewed and reactive.  

## 2011-10-18 NOTE — Progress Notes (Signed)
NST reviewed and reactive.  

## 2011-10-21 ENCOUNTER — Telehealth: Payer: Self-pay | Admitting: Obstetrics & Gynecology

## 2011-10-21 ENCOUNTER — Encounter: Payer: Self-pay | Admitting: Obstetrics and Gynecology

## 2011-10-21 ENCOUNTER — Other Ambulatory Visit: Payer: Self-pay

## 2011-10-21 NOTE — Telephone Encounter (Signed)
Patient was called with Interpreter Raynelle Fanning, and a message was left to call [phone] or [phone]

## 2011-10-22 ENCOUNTER — Ambulatory Visit (INDEPENDENT_AMBULATORY_CARE_PROVIDER_SITE_OTHER): Payer: Self-pay | Admitting: Obstetrics & Gynecology

## 2011-10-22 ENCOUNTER — Telehealth (HOSPITAL_COMMUNITY): Payer: Self-pay | Admitting: *Deleted

## 2011-10-22 VITALS — BP 106/65 | Temp 97.5°F | Wt 205.2 lb

## 2011-10-22 DIAGNOSIS — O0930 Supervision of pregnancy with insufficient antenatal care, unspecified trimester: Secondary | ICD-10-CM

## 2011-10-22 LAB — POCT URINALYSIS DIP (DEVICE)
Nitrite: NEGATIVE
Protein, ur: NEGATIVE mg/dL
Urobilinogen, UA: 0.2 mg/dL (ref 0.0–1.0)

## 2011-10-22 NOTE — Telephone Encounter (Signed)
Interpreter [phone] Preadmission screen

## 2011-10-22 NOTE — Progress Notes (Signed)
P - 97 

## 2011-10-22 NOTE — Progress Notes (Signed)
Needs induction for post dates.  Needs AFI.

## 2011-10-22 NOTE — Progress Notes (Signed)
Pt reports decr. FM since yesterday- she felt good FM during NST.  Normal patterns of FM discussed for this EGA. Pt instructed to return to hospital for sx of labor or decr. FM.  Pt voiced understanding.  Lindsay Tyler- interpreter present for questions and instructions.

## 2011-10-23 ENCOUNTER — Encounter (HOSPITAL_COMMUNITY): Payer: Self-pay

## 2011-10-23 ENCOUNTER — Inpatient Hospital Stay (HOSPITAL_COMMUNITY)
Admission: RE | Admit: 2011-10-23 | Discharge: 2011-10-26 | DRG: 775 | Disposition: A | Discharge status: Home / Self Care | Payer: Medicaid Other | Source: Ambulatory Visit | Attending: Obstetrics & Gynecology | Admitting: Obstetrics & Gynecology

## 2011-10-23 ENCOUNTER — Encounter (HOSPITAL_COMMUNITY): Payer: Self-pay | Admitting: *Deleted

## 2011-10-23 ENCOUNTER — Telehealth (HOSPITAL_COMMUNITY): Payer: Self-pay | Admitting: *Deleted

## 2011-10-23 DIAGNOSIS — O480 Post-term pregnancy: Principal | ICD-10-CM | POA: Diagnosis present

## 2011-10-23 LAB — CBC
HCT: 33.8 % — ABNORMAL LOW (ref 36.0–46.0)
Hemoglobin: 11.3 g/dL — ABNORMAL LOW (ref 12.0–15.0)
MCH: 27.4 pg (ref 26.0–34.0)
MCHC: 33.4 g/dL (ref 30.0–36.0)
MCV: 81.8 fL (ref 78.0–100.0)
RBC: 4.13 MIL/uL (ref 3.87–5.11)

## 2011-10-23 MED ORDER — IBUPROFEN 600 MG PO TABS
600.0000 mg | ORAL_TABLET | Freq: Four times a day (QID) | ORAL | Status: DC | PRN
  Administered 2011-10-25: 600 mg via ORAL
  Filled 2011-10-23: qty 1

## 2011-10-23 MED ORDER — TERBUTALINE SULFATE 1 MG/ML IJ SOLN: 0.2500 mg | Freq: Once | INTRAMUSCULAR | Status: AC | PRN

## 2011-10-23 MED ORDER — ONDANSETRON HCL 4 MG/2ML IJ SOLN: 4.0000 mg | Freq: Four times a day (QID) | INTRAMUSCULAR | Status: DC | PRN

## 2011-10-23 MED ORDER — OXYTOCIN 40 UNITS IN LACTATED RINGERS INFUSION - SIMPLE MED
62.5000 mL/h | Freq: Once | INTRAVENOUS | Status: AC
  Administered 2011-10-25: 62.5 mL/h via INTRAVENOUS

## 2011-10-23 MED ORDER — LIDOCAINE HCL (PF) 1 % IJ SOLN
30.0000 mL | INTRAMUSCULAR | Status: DC | PRN
  Filled 2011-10-23: qty 30

## 2011-10-23 MED ORDER — LACTATED RINGERS IV SOLN
INTRAVENOUS | Status: DC
  Administered 2011-10-23 – 2011-10-25 (×4): via INTRAVENOUS

## 2011-10-23 MED ORDER — ACETAMINOPHEN 325 MG PO TABS: 650.0000 mg | ORAL_TABLET | ORAL | Status: DC | PRN

## 2011-10-23 MED ORDER — OXYTOCIN BOLUS FROM INFUSION
500.0000 mL | Freq: Once | INTRAVENOUS | Status: DC
  Filled 2011-10-23: qty 500

## 2011-10-23 MED ORDER — LACTATED RINGERS IV SOLN: 500.0000 mL | INTRAVENOUS | Status: DC | PRN

## 2011-10-23 MED ORDER — OXYCODONE-ACETAMINOPHEN 5-325 MG PO TABS
1.0000 | ORAL_TABLET | ORAL | Status: DC | PRN
  Administered 2011-10-25: 1 via ORAL
  Filled 2011-10-23: qty 1

## 2011-10-23 MED ORDER — FLEET ENEMA 7-19 GM/118ML RE ENEM: 1.0000 | ENEMA | RECTAL | Status: DC | PRN

## 2011-10-23 MED ORDER — OXYTOCIN 40 UNITS IN LACTATED RINGERS INFUSION - SIMPLE MED
1.0000 m[IU]/min | INTRAVENOUS | Status: DC
  Administered 2011-10-23: 2 m[IU]/min via INTRAVENOUS
  Filled 2011-10-23: qty 1000

## 2011-10-23 MED ORDER — CITRIC ACID-SODIUM CITRATE 334-500 MG/5ML PO SOLN: 30.0000 mL | ORAL | Status: DC | PRN

## 2011-10-23 MED ORDER — FENTANYL CITRATE 0.05 MG/ML IJ SOLN
100.0000 ug | INTRAMUSCULAR | Status: DC | PRN
  Administered 2011-10-25 (×2): 100 ug via INTRAVENOUS
  Filled 2011-10-23 (×2): qty 2

## 2011-10-23 NOTE — Telephone Encounter (Signed)
Preadmission screen interperter number [phone]

## 2011-10-23 NOTE — H&P (Signed)
Lindsay Tyler is a 26 y.o. G62P1001 Hispanic female presenting for IOL for post-term pregnancy. Maternal Medical History:  Fetal activity: Perceived fetal activity is normal.   Last perceived fetal movement was within the past hour.      OB History    Grav Para Term Preterm Abortions TAB SAB Ect Mult Living   2 1 1  0 0 0 0 0 0 1     Past Medical History  Diagnosis Date  . No pertinent past medical history    Past Surgical History  Procedure Date  . No past surgeries    Family History: family history includes Cancer in her mother; Diabetes in her mother; Heart disease in her mother; and Hypertension in her mother. Social History:  reports that she has never smoked. She has never used smokeless tobacco. She reports that she does not drink alcohol or use illicit drugs.   Prenatal Transfer Tool  Maternal Diabetes: No Genetic Screening: late to care- Declined Maternal Ultrasounds/Referrals: Normal Fetal Ultrasounds or other Referrals:  None Maternal Substance Abuse:  No Significant Maternal Medications:  None Significant Maternal Lab Results:  None Other Comments:  None  Review of Systems  Constitutional: Negative.   HENT: Negative.   Eyes: Negative.   Respiratory: Negative.   Cardiovascular: Negative.   Gastrointestinal: Negative.   Genitourinary: Negative.   Skin: Negative.   Neurological: Negative.    Dilation: 1.5 Effacement (%): 60 Station: -3 Blood pressure 120/73, pulse 88, temperature 98.3 F (36.8 C), temperature source Oral, resp. rate 20, height 5' 1.25" (1.556 m), weight 92.987 kg (205 lb), last menstrual period 11/01/2010.  Maternal Exam:  Uterine Assessment: Contraction strength is mild.  Contraction frequency is rare.   Abdomen: Patient reports no abdominal tenderness. Estimated fetal weight is 7.5 lbs.   Fetal presentation: vertex  Introitus: Normal vulva. Normal vagina.    Fetal Exam Fetal Monitor Review: Mode: ultrasound.   Baseline rate:  145.  Variability: moderate (6-25 bpm).   Pattern: accelerations present and no decelerations.    Fetal State Assessment: Category I - tracings are normal.     Physical Exam  Constitutional: She is oriented to person, place, and time. She appears well-developed.  HENT:  Head: Normocephalic.  Neck: Normal range of motion.  Cardiovascular: Normal rate.   Respiratory: Effort normal.  GI: Soft.  Genitourinary: Vagina normal.  Musculoskeletal: Normal range of motion.  Neurological: She is alert and oriented to person, place, and time.  Skin: Skin is warm and dry.  Psychiatric: She has a normal mood and affect.    Dilation: 1.5 Effacement (%): 60 Station: -3 Presentation: Vertex  Prenatal labs: ABO, Rh: O/POS/-- (06/12 1151) Antibody: NEG (06/12 1151) Rubella: 168.9 (06/12 1151) RPR: NON REAC (06/12 1151)  HBsAg: NEGATIVE (06/12 1151)  HIV: NON REACTIVE (06/12 1151)  GBS: Negative (07/31 0000)  1hr [id number] Assessment/Plan: IUP@[redacted]w[redacted]d  Post-term pregnancy  Admit, efm per unit policy, pitocin for induction, epidural prn  [name] 10/23/2011, 9:06 PM

## 2011-10-24 LAB — ABO/RH: ABO/RH(D): O POS

## 2011-10-24 MED ORDER — TERBUTALINE SULFATE 1 MG/ML IJ SOLN: 0.2500 mg | Freq: Once | INTRAMUSCULAR | Status: AC | PRN

## 2011-10-24 MED ORDER — MISOPROSTOL 25 MCG QUARTER TABLET
25.0000 ug | ORAL_TABLET | ORAL | Status: DC | PRN
  Administered 2011-10-24 (×2): 25 ug via VAGINAL
  Filled 2011-10-24 (×4): qty 0.25

## 2011-10-24 NOTE — Progress Notes (Signed)
Lindsay Tyler is a 26 y.o. G2P1001 at [redacted]w[redacted]d admitted for induction of labor due to Post dates.  Subjective: Comfortable w/o complaints  Objective: BP 118/72  Pulse 76  Temp 98 F (36.7 C) (Oral)  Resp 18  Ht 5' 1.25" (1.556 m)  Wt 92.987 kg (205 lb)  BMI 38.42 kg/m2  LMP 11/01/2010      FHT:  FHR: 140 bpm, variability: moderate,  accelerations:  Present,  decelerations:  Absent UC:   irregular, every 3-6 minutes, not tracing well d/t maternal position SVE:   Dilation: 2.5 Effacement (%): 50 Station: -3 Exam by:: Cleone Slim RN   Labs: Lab Results  Component Value Date   WBC 8.8 10/23/2011   HGB 11.3* 10/23/2011   HCT 33.8* 10/23/2011   MCV 81.8 10/23/2011   PLT 269 10/23/2011    Assessment / Plan: IOL d/t postdates, s/p 2nd dose of cytotec  Labor: not yet Preeclampsia:  n/a Fetal Wellbeing:  Category I Pain Control:  n/a I/D:  n/a Anticipated MOD:  NSVD  Marge Duncans 10/24/2011, 7:11 PM

## 2011-10-24 NOTE — Progress Notes (Signed)
Shawna Clamp SCNM

## 2011-10-24 NOTE — Progress Notes (Signed)
Lindsay Tyler is a 26 y.o. G2P1001 at [redacted]w[redacted]d  admitted for induction of labor due to Post dates. Interpreter in room for exam  Subjective: Feeling slightly uncomfortable with uc's.  Reports being hungry.  Objective: BP 98/54  Pulse 73  Temp 97.9 F (36.6 C) (Oral)  Resp 18  Ht 5' 1.25" (1.556 m)  Wt 92.987 kg (205 lb)  BMI 38.42 kg/m2  LMP 11/01/2010      FHT:  FHR: 130 bpm, variability: moderate,  accelerations:  Present,  decelerations:  Absent UC:   regular, every 2-3 minutes SVE:   2/50/-3, very posterior, balottable (my exam)  Labs: Lab Results  Component Value Date   WBC 8.8 10/23/2011   HGB 11.3* 10/23/2011   HCT 33.8* 10/23/2011   MCV 81.8 10/23/2011   PLT 269 10/23/2011   On 30mu/min pitocin now, has been on pitocin x 14hrs   Assessment / Plan: IOL for postdates  Labor: IOL, on 43mu/min pitocin, has been on pitocin x 14hrs without progressive cervical change Preeclampsia:  n/a Fetal Wellbeing:  Category I Pain Control:  none I/D:  n/a Anticipated MOD:  NSVD  Will turn pitocin off for now.  Allow pt to eat light lunch.  Begin cytotec per protocol.   Marge Duncans 10/24/2011, 11:44 AM

## 2011-10-24 NOTE — Progress Notes (Signed)
Lindsay Tyler is a 26 y.o. G2P1001 at [redacted]w[redacted]d  admitted for induction of labor due to Post dates.   Subjective: Comfortable without pain  Objective: BP 110/69  Pulse 68  Temp 98 F (36.7 C) (Oral)  Resp 20  Ht 5' 1.25" (1.556 m)  Wt 92.987 kg (205 lb)  BMI 38.42 kg/m2  LMP 11/01/2010      FHT:  FHR: 125 bpm, variability: moderate,  accelerations:  Present,  decelerations:  Absent UC:   regular, every 2-4 minutes SVE:   3/60/-3 by Denyse Amass, SNM  Labs: Lab Results  Component Value Date   WBC 8.8 10/23/2011   HGB 11.3* 10/23/2011   HCT 33.8* 10/23/2011   MCV 81.8 10/23/2011   PLT 269 10/23/2011   On 71mu/min pitocin  Assessment / Plan: IOL for postdates, on 30mu/min pitocin  Labor: IOL, no labor at this time Preeclampsia:  n/a Fetal Wellbeing:  Category I Pain Control:  n/a I/D:  n/a Anticipated MOD:  NSVD  Marge Duncans 10/24/2011, 11:49 AM

## 2011-10-24 NOTE — Progress Notes (Signed)
Lindsay Tyler is a 26 y.o. G2P1001 at [redacted]w[redacted]d admitted for induction of labor due to Post dates.   Subjective: Sleeping  Objective: BP 104/54  Pulse 81  Temp 98.7 F (37.1 C) (Oral)  Resp 18  Ht 5' 1.25" (1.556 m)  Wt 92.987 kg (205 lb)  BMI 38.42 kg/m2  LMP 11/01/2010      FHT:  FHR: 150 bpm, variability: moderate,  accelerations:  Present,  decelerations:  Absent UC:   irregular, every 4-5 minutes, not tracing well d/t maternal position SVE:   Dilation: 2.5 Effacement (%): 50 Station: -3 Exam by:: Cleone Slim RN   Labs: Lab Results  Component Value Date   WBC 8.8 10/23/2011   HGB 11.3* 10/23/2011   HCT 33.8* 10/23/2011   MCV 81.8 10/23/2011   PLT 269 10/23/2011    Assessment / Plan: IOL d/t postdates, S/P 2nd dose cytotec @ 1526  Labor: not yet Preeclampsia:  n/a Fetal Wellbeing:  Category I Pain Control:  n/a I/D:  n/a Anticipated MOD:  NSVD  Marge Duncans 10/24/2011, 3:51 PM

## 2011-10-24 NOTE — Progress Notes (Signed)
Lindsay Tyler is a 26 y.o. G2P1001 at [redacted]w[redacted]d admitted for induction of labor due to Post dates. Due date 10/13/11.  Subjective: Some pain with ctx.  Objective: BP 101/63  Pulse 75  Temp 98.1 F (36.7 C) (Oral)  Resp 18  Ht 5' 1.25" (1.556 m)  Wt 92.987 kg (205 lb)  BMI 38.42 kg/m2  LMP 11/01/2010      FHT:  FHR: 130 bpm, variability: moderate,  accelerations:  Present,  decelerations:  Absent UC:   regular, every 2-3 minutes SVE:   Dilation: 3 Effacement (%): 60 Station: -3 Exam by:: Kahne Helfand, SNM  Labs: Lab Results  Component Value Date   WBC 8.8 10/23/2011   HGB 11.3* 10/23/2011   HCT 33.8* 10/23/2011   MCV 81.8 10/23/2011   PLT 269 10/23/2011   Pitocin @ 56mu/min Assessment / Plan: Induction of labor due to postterm,  progressing well on pitocin  Labor: Progressing normally Fetal Wellbeing:  Category I Pain Control:  Labor support without medications I/D:  n/a Anticipated MOD:  NSVD  [name] 10/24/2011, 5:44 AM

## 2011-10-24 NOTE — Progress Notes (Signed)
Shawna Clamp SCNM reviewed strip

## 2011-10-24 NOTE — Progress Notes (Signed)
Amerah Puleo is a 26 y.o. G2P1001 at [redacted]w[redacted]d admitted for induction of labor due to Post dates. Due date 10/13/11.  Subjective: Starting to feel ctx.  Objective: BP 101/63  Pulse 75  Temp 98.1 F (36.7 C) (Oral)  Resp 18  Ht 5' 1.25" (1.556 m)  Wt 92.987 kg (205 lb)  BMI 38.42 kg/m2  LMP 11/01/2010      FHT:  FHR: 140 bpm, variability: moderate,  accelerations:  Abscent,  decelerations:  Absent UC:   irregular, every 3-4 minutes SVE:   1.5/60/-3  Pitocin @8mu /min Labs: Lab Results  Component Value Date   WBC 8.8 10/23/2011   HGB 11.3* 10/23/2011   HCT 33.8* 10/23/2011   MCV 81.8 10/23/2011   PLT 269 10/23/2011    Assessment / Plan: IUP@[redacted]w[redacted]d  IOL for post term Latent phase  Fetal Wellbeing:  Category II Pain Control:  Labor support without medications I/D:  n/a Anticipated MOD:  NSVD  [name] 10/24/2011, 5:48 AM

## 2011-10-24 NOTE — Progress Notes (Signed)
I was notified of VE and agree with above.  [place], CNM 10/24/2011 9:01 AM

## 2011-10-24 NOTE — Progress Notes (Signed)
I was notified of VE and agree with above.  [place], CNM 10/24/2011 9:03 AM

## 2011-10-24 NOTE — Progress Notes (Signed)
Lindsay Tyler is a 26 y.o. G2P1001 at [redacted]w[redacted]d admitted for induction of labor due to Post dates.  Subjective: Comfortable, no complaints  Objective: BP 118/69  Pulse 79  Temp 98 F (36.7 C) (Oral)  Resp 18  Ht 5' 1.25" (1.556 m)  Wt 92.987 kg (205 lb)  BMI 38.42 kg/m2  LMP 11/01/2010      FHT:  FHR: 140 bpm, variability: moderate,  accelerations:  Present,  decelerations:  Absent UC:   irregular, every 6-11 minutes SVE:   Dilation: 2.5 Effacement (%): 60 Station: -3 Exam by:: A.Davis,RN  Labs: Lab Results  Component Value Date   WBC 8.8 10/23/2011   HGB 11.3* 10/23/2011   HCT 33.8* 10/23/2011   MCV 81.8 10/23/2011   PLT 269 10/23/2011    Assessment / Plan: IOL d/t postdates, 3rd cytotec placed @ 2030  Labor: not yet Preeclampsia:  n/a Fetal Wellbeing:  Category I Pain Control:  n/a I/D:  n/a Anticipated MOD:  NSVD  Marge Duncans 10/24/2011, 9:52 PM

## 2011-10-24 NOTE — Progress Notes (Signed)
Maternal Heart rate noted due to pt sitting

## 2011-10-24 NOTE — Progress Notes (Signed)
OK per Joellyn Haff CNM for PT to ambulate and come off EFM before next SVE at 2000.

## 2011-10-24 NOTE — Progress Notes (Signed)
Jan Walters is a 26 y.o. G2P1001 at [redacted]w[redacted]d admitted for induction of labor due to Post dates.   Subjective: Doing well, uc's feel about the same. Ate lunch.  Objective: BP 110/69  Pulse 68  Temp 98 F (36.7 C) (Oral)  Resp 18  Ht 5' 1.25" (1.556 m)  Wt 92.987 kg (205 lb)  BMI 38.42 kg/m2  LMP 11/01/2010      FHT:  FHR: 130 bpm, variability: moderate,  accelerations:  Present,  decelerations:  Absent UC:   irregular, every 2-5 minutes SVE:   Dilation: 2 Effacement (%): 50 Station: -3 Exam by:: K. Kainalu Heggs  Labs: Lab Results  Component Value Date   WBC 8.8 10/23/2011   HGB 11.3* 10/23/2011   HCT 33.8* 10/23/2011   MCV 81.8 10/23/2011   PLT 269 10/23/2011    Assessment / Plan: IOL d/t postdates, pitocin off at 1145, 1st dose of cytotec ~1200  Labor: not yet Preeclampsia:  n/a Fetal Wellbeing:  Category I Pain Control:  n/a I/D:  n/a Anticipated MOD:  NSVD  Marge Duncans 10/24/2011, 1:36 PM

## 2011-10-25 ENCOUNTER — Encounter (HOSPITAL_COMMUNITY): Payer: Self-pay

## 2011-10-25 DIAGNOSIS — O480 Post-term pregnancy: Secondary | ICD-10-CM

## 2011-10-25 MED ORDER — SODIUM CHLORIDE 0.9 % IV SOLN: 250.0000 mL | INTRAVENOUS | Status: DC | PRN

## 2011-10-25 MED ORDER — BENZOCAINE-MENTHOL 20-0.5 % EX AERO
1.0000 "application " | INHALATION_SPRAY | CUTANEOUS | Status: DC | PRN
  Administered 2011-10-25: 1 via TOPICAL
  Filled 2011-10-25: qty 56

## 2011-10-25 MED ORDER — ONDANSETRON HCL 4 MG/2ML IJ SOLN: 4.0000 mg | INTRAMUSCULAR | Status: DC | PRN

## 2011-10-25 MED ORDER — BISACODYL 10 MG RE SUPP: 10.0000 mg | Freq: Every day | RECTAL | Status: DC | PRN

## 2011-10-25 MED ORDER — SENNOSIDES-DOCUSATE SODIUM 8.6-50 MG PO TABS
2.0000 | ORAL_TABLET | Freq: Every day | ORAL | Status: DC
  Administered 2011-10-25: 2 via ORAL

## 2011-10-25 MED ORDER — LANOLIN HYDROUS EX OINT: TOPICAL_OINTMENT | CUTANEOUS | Status: DC | PRN

## 2011-10-25 MED ORDER — DIBUCAINE 1 % RE OINT: 1.0000 "application " | TOPICAL_OINTMENT | RECTAL | Status: DC | PRN

## 2011-10-25 MED ORDER — OXYTOCIN 40 UNITS IN LACTATED RINGERS INFUSION - SIMPLE MED
1.0000 m[IU]/min | INTRAVENOUS | Status: DC
  Administered 2011-10-25: 2 m[IU]/min via INTRAVENOUS
  Filled 2011-10-25: qty 1000

## 2011-10-25 MED ORDER — TETANUS-DIPHTH-ACELL PERTUSSIS 5-2.5-18.5 LF-MCG/0.5 IM SUSP: 0.5000 mL | Freq: Once | INTRAMUSCULAR | Status: DC

## 2011-10-25 MED ORDER — PRENATAL MULTIVITAMIN CH
1.0000 | ORAL_TABLET | Freq: Every day | ORAL | Status: DC
  Administered 2011-10-25 – 2011-10-26 (×2): 1 via ORAL
  Filled 2011-10-25 (×2): qty 1

## 2011-10-25 MED ORDER — ONDANSETRON HCL 4 MG PO TABS: 4.0000 mg | ORAL_TABLET | ORAL | Status: DC | PRN

## 2011-10-25 MED ORDER — SODIUM CHLORIDE 0.9 % IJ SOLN: 3.0000 mL | Freq: Two times a day (BID) | INTRAMUSCULAR | Status: DC

## 2011-10-25 MED ORDER — TERBUTALINE SULFATE 1 MG/ML IJ SOLN: 0.2500 mg | Freq: Once | INTRAMUSCULAR | Status: DC | PRN

## 2011-10-25 MED ORDER — SODIUM CHLORIDE 0.9 % IJ SOLN: 3.0000 mL | INTRAMUSCULAR | Status: DC | PRN

## 2011-10-25 MED ORDER — SIMETHICONE 80 MG PO CHEW: 80.0000 mg | CHEWABLE_TABLET | ORAL | Status: DC | PRN

## 2011-10-25 MED ORDER — DIPHENHYDRAMINE HCL 25 MG PO CAPS: 25.0000 mg | ORAL_CAPSULE | Freq: Four times a day (QID) | ORAL | Status: DC | PRN

## 2011-10-25 MED ORDER — OXYCODONE-ACETAMINOPHEN 5-325 MG PO TABS: 1.0000 | ORAL_TABLET | ORAL | Status: DC | PRN

## 2011-10-25 MED ORDER — IBUPROFEN 600 MG PO TABS
600.0000 mg | ORAL_TABLET | Freq: Four times a day (QID) | ORAL | Status: DC
  Administered 2011-10-25 – 2011-10-26 (×3): 600 mg via ORAL
  Filled 2011-10-25 (×5): qty 1

## 2011-10-25 MED ORDER — WITCH HAZEL-GLYCERIN EX PADS: 1.0000 "application " | MEDICATED_PAD | CUTANEOUS | Status: DC | PRN

## 2011-10-25 MED ORDER — FLEET ENEMA 7-19 GM/118ML RE ENEM: 1.0000 | ENEMA | Freq: Every day | RECTAL | Status: DC | PRN

## 2011-10-25 MED ORDER — OXYTOCIN 40 UNITS IN LACTATED RINGERS INFUSION - SIMPLE MED: 62.5000 mL/h | INTRAVENOUS | Status: DC | PRN

## 2011-10-25 MED ORDER — MEASLES, MUMPS & RUBELLA VAC ~~LOC~~ INJ
0.5000 mL | INJECTION | Freq: Once | SUBCUTANEOUS | Status: DC
  Filled 2011-10-25: qty 0.5

## 2011-10-25 MED ORDER — ZOLPIDEM TARTRATE 5 MG PO TABS: 5.0000 mg | ORAL_TABLET | Freq: Every evening | ORAL | Status: DC | PRN

## 2011-10-25 NOTE — Progress Notes (Signed)
Lindsay Tyler is a 26 y.o. G2P1001 at [redacted]w[redacted]d admitted for induction of labor due to Post dates.  Spanish interpreter present for exam  Subjective: No discomfort, no complaints.  Objective: BP 118/69  Pulse 79  Temp 98 F (36.7 C) (Oral)  Resp 18  Ht 5' 1.25" (1.556 m)  Wt 92.987 kg (205 lb)  BMI 38.42 kg/m2  LMP 11/01/2010      FHT:  FHR: 130 bpm, variability: moderate,  accelerations:  Present,  decelerations:  Absent UC:   irregular, every 4-7 minutes SVE:   Dilation: 3 Effacement (%): 50 Station: -2 Exam by:: Grace Bushy, CNM  Cervical foley bulb placed and inflated w/ 60ml water w/o difficulty  Labs: Lab Results  Component Value Date   WBC 8.8 10/23/2011   HGB 11.3* 10/23/2011   HCT 33.8* 10/23/2011   MCV 81.8 10/23/2011   PLT 269 10/23/2011    Assessment / Plan: IOL d/t postdates, s/p 3 doses of cytotec, last 4hrs ago Cervical foley bulb in place, will begin low dose pitocin per protocol, not to exceed 24mu/min while foley bulb in  Labor: not yet Preeclampsia:  n/a Fetal Wellbeing:  Category I Pain Control:  n/a I/D:  n/a Anticipated MOD:  NSVD  Marge Duncans 10/25/2011, 12:50 AM

## 2011-10-25 NOTE — Progress Notes (Signed)
Lindsay Tyler is a 26 y.o. G2P1001 at 103w5d admitted for induction of labor due to Post dates.  Subjective: Beginning to feel more uncomfortable w/ uc's- breathing well.   Objective: BP 99/62  Pulse 80  Temp 98.1 F (36.7 C) (Tympanic)  Resp 18  Ht 5' 1.25" (1.556 m)  Wt 92.987 kg (205 lb)  BMI 38.42 kg/m2  LMP 11/01/2010      FHT:  FHR: 130 bpm, variability: moderate,  accelerations:  Present,  decelerations:  Absent UC:   Irregular, not tracing well SVE:   Dilation: 5 Effacement (%): 80 Station: -1 Exam by:: Janilah Hojnacki, CNM Foley bulb fell out @ ~0150  Labs: Lab Results  Component Value Date   WBC 8.8 10/23/2011   HGB 11.3* 10/23/2011   HCT 33.8* 10/23/2011   MCV 81.8 10/23/2011   PLT 269 10/23/2011    Assessment / Plan: IOL d/t postdates, foley bulb fell out, now on pitocin 64mu/min  Labor: beginning active phase, continue to increase pitocin per protocol as needed Preeclampsia:  n/a Fetal Wellbeing:  Category I Pain Control:  Labor support without medications I/D:  n/a Anticipated MOD:  NSVD  Marge Duncans 10/25/2011, 3:27 AM

## 2011-10-26 LAB — CBC
Hemoglobin: 9.9 g/dL — ABNORMAL LOW (ref 12.0–15.0)
MCH: 27.2 pg (ref 26.0–34.0)
Platelets: 259 10*3/uL (ref 150–400)
RBC: 3.64 MIL/uL — ABNORMAL LOW (ref 3.87–5.11)
WBC: 9.4 10*3/uL (ref 4.0–10.5)

## 2011-10-26 MED ORDER — IBUPROFEN 600 MG PO TABS: 600.0000 mg | ORAL_TABLET | Freq: Four times a day (QID) | ORAL | Status: AC

## 2011-10-26 NOTE — Progress Notes (Signed)
Ur chart review completed.  

## 2011-10-26 NOTE — Progress Notes (Signed)
Pt discharged before Sw could assess reason for [facility]. Sw will monitor drug screens results and make a referral if needed.

## 2011-10-26 NOTE — Discharge Summary (Signed)
Attestation of Attending Supervision of Advanced Practitioner (CNM/NP): Evaluation and management procedures were performed by the Advanced Practitioner under my supervision and collaboration.  I have reviewed the Advanced Practitioner's note and chart, and I agree with the management and plan.  HARRAWAY-SMITH, Gracyn Santillanes 8:59 AM     

## 2011-10-26 NOTE — Discharge Summary (Signed)
Obstetric Discharge Summary Reason for Admission: induction of labor Prenatal Procedures: none Intrapartum Procedures: spontaneous vaginal delivery Postpartum Procedures: none Complications-Operative and Postpartum: none Hemoglobin  Date Value Range Status  10/26/2011 9.9* 12.0 - 15.0 g/dL Final     HCT  Date Value Range Status  10/26/2011 30.1* 36.0 - 46.0 % Final    Physical Exam:  General: alert and cooperative Lochia: appropriate Uterine Fundus: firm Incision: n/a DVT Evaluation: No evidence of DVT seen on physical exam.  Discharge Diagnoses: Term Pregnancy-delivered  Discharge Information: Date: 10/26/2011 Activity: pelvic rest Diet: routine Medications: PNV and Ibuprofen Condition: stable Instructions: refer to practice specific booklet Discharge to: home Follow-up Information    Follow up with [facility] in 6 weeks.         Newborn Data: Live born female  Birth Weight: 8 lb 11 oz (3940 g) APGAR: 8, 9  Home with mother.  [name] 10/26/2011, 7:44 AM  I examined pt and agree with documentation above and nurse midwife student plan of care. [facility]

## 2011-10-27 LAB — TYPE AND SCREEN
Antibody Screen: NEGATIVE
Unit division: 0

## 2011-11-02 NOTE — H&P (Signed)
Agree with above note.  Lindsay Tyler H. 11/02/2011 10:03 PM  

## 2011-11-23 ENCOUNTER — Encounter: Payer: Self-pay | Admitting: Family Medicine

## 2011-11-23 ENCOUNTER — Ambulatory Visit (INDEPENDENT_AMBULATORY_CARE_PROVIDER_SITE_OTHER): Payer: Self-pay | Admitting: Family Medicine

## 2011-11-23 DIAGNOSIS — N898 Other specified noninflammatory disorders of vagina: Secondary | ICD-10-CM

## 2011-11-23 NOTE — Progress Notes (Signed)
Patient ID: Lindsay Tyler, female   DOB: 1985/03/06, 26 y.o.   MRN: [id number] Subjective:     Carollee Nussbaumer is a 26 y.o. female who presents for a postpartum visit. She is 4 week postpartum following a spontaneous vaginal delivery. I have fully reviewed the prenatal and intrapartum course. The delivery was at 42 gestational weeks. Outcome: spontaneous vaginal delivery. Anesthesia: none. Postpartum course has been normal/unremarkable. Baby's course has been doing well. Baby is feeding by both breast and bottle - Gerber. Bleeding no bleeding and stopped 3 days ago.. Bowel function is normal. Bladder function is normal. Patient is not sexually active. Contraception method is none. Postpartum depression screening: negative.  The following portions of the patient's history were reviewed and updated as appropriate: allergies, current medications, past family history, past medical history, past social history, past surgical history and problem list.  Review of Systems Constitutional: negative for anorexia, chills, fevers and malaise Eyes: negative for visual disturbance Respiratory: negative for asthma, cough, dyspnea on exertion and wheezing Cardiovascular: negative for chest pain, dyspnea and palpitations Gastrointestinal: negative for abdominal pain, constipation, diarrhea, nausea, reflux symptoms and vomiting Genitourinary:negative for genital lesions, vaginal discharge and dysuria, urinary incontinence Integument/breast: negative for breast lump, breast tenderness and nipple discharge Musculoskeletal:negative for back pain Neurological: negative for dizziness Behavioral/Psych: negative for depression   Objective:    BP 105/73  Pulse 77  Temp 97.7 F (36.5 C) (Oral)  Wt 182 lb 12.8 oz (82.918 kg)  Breastfeeding? Yes  General:  alert, cooperative and no distress   Breasts:  inspection negative, no nipple discharge or bleeding, no masses or nodularity palpable  Lungs: clear to  auscultation bilaterally  Heart:  regular rate and rhythm, S1, S2 normal, no murmur, click, rub or gallop  Abdomen: soft, non-tender; bowel sounds normal; no masses,  no organomegaly   Vulva:  normal  Vagina: normal, thin watery discharge  Cervix:  multiparous appearance and no cervical motion tenderness  Corpus: normal size, contour, position, consistency, mobility, non-tender  Adnexa:  no mass, fullness, tenderness  Rectal Exam: Not performed.        Assessment:    Normal postpartum exam. Pap smear not done at today's visit.   Plan:    1. Contraception: Wants to get Nexplanon and awaiting Medicaid approval for Nexplanon placement. 2. Follow up in: 1 year or as needed.

## 2011-11-23 NOTE — Patient Instructions (Signed)
Follow up at Health Department or [facility] for [facility] when [facility] approved.    Eleccin del mtodo anticonceptivo  (Contraception Choices) La anticoncepcin (control de la natalidad) es el uso de cualquier mtodo o dispositivo para Location manager. A continuacin se indican algunos de esos mtodos.  MTODOS HORMONALES   Implante anticonceptivo. Es un tubo plstico delgado que contiene la hormona progesterona. No contiene estrgenos. El mdico inserta el tubo en la parte interna del brazo. El tubo puede Geneticist, molecular durante 3 aos. Despus de los 3 aos debe retirarse. El implante impide que los ovarios liberen vulos (ovulacin), espesa el moco cervical, lo que evita que los espermatozoides ingresen al tero y hace ms delgada la membrana que cubre el interior del tero.  Inyecciones de progesterona sola. Estas inyecciones se administran cada 3 meses para evitar el embarazo. La progesterona sinttica impide que los ovarios liberen vulos. Tambin hace que el moco cervical se espese y modifica el recubrimiento interno del tero. Esto hace ms difcil que los espermatozoides sobrevivan en el tero.  Pldoras anticonceptivas. Las pldoras anticonceptivas contienen estrgenos y Education officer, museum. Actan impidiendo que el vulo se forme en el ovario(ovulacin). Las pldoras anticonceptivas son recetadas por el mdico.Tambin se utilizan para tratar los perodos menstruales abundantes.  Minipldora. Este tipo de pldora anticonceptiva contiene slo hormona progesterona. Deben tomarse todos los [address]  [address] del mes y debe recetarlas el mdico.  Parches anticonceptivos. El parche contiene hormonas similares a las que contienen las pldoras anticonceptivas. Deben cambiarse una vez por semana y se utilizan bajo prescripcin mdica.  Anillo vaginal. Anillo vaginal contiene hormonas similares a las que contienen las pldoras anticonceptivas. Se deja colocado durante tres semanas, se lo retira  durante 1 semana y luego se coloca uno nuevo. La paciente debe sentirse cmoda para insertar y retirar el anillo de la vagina.Es necesaria la receta del mdico.  Anticonceptivos de Associate Professor. Los anticonceptivos de emergencia son mtodos para evitar un embarazo despus de una relacin sexual sin proteccin. Esta pldora puede tomarse inmediatamente despus de Child psychotherapist sexuales o hasta 5 [place] de haber tenido sexo sin proteccin. Es ms efectiva si se toma poco tiempo despus. Los anticonceptivos de emergencia estn disponibles sin prescripcin mdica. Consltelo con su farmacutico. No use los anticonceptivos de emergencia como nico mtodo anticonceptivo. MTODOS DE BARRERA   Condn masculino. Es una vaina delgada (ltex o goma) que se [place] en el pene durante el acto sexual. Deri Fuelling con espermicida para aumentar la efectividad.  Condn femenino. Es una vaina blanda y floja que se adapta suavemente a la vagina antes de las relaciones sexuales.  Diafragma. Es una barrera de ltex redonda y Casimer Bilis que debe ser ajustada por un profesional. Se inserta en la vagina, junto con un gel espermicida. Debe insertarse antes de Management consultant. Debe dejar el diafragma colocado en la vagina durante 6 a 8 horas despus de la relacin sexual.  Capuchn cervical. Es una taza de ltex o plstico, redonda y [place] que cubre el cuello del tero y debe ser ajustada por un mdico. Puede dejarlo colocado en la vagina hasta 48 horas despus de las Clinical research associate.  Esponja. Es una pieza blanda y circular de espuma de poliuretano. Contiene un espermicida. Se inserta en la vagina despus de mojarla y antes de las [employer].  Espermicidas. Los espermicidas son qumicos que matan o bloquean el esperma y no lo dejan ingresar al cuello del tero y al tero. Vienen en forma de cremas, geles, supositorios, espuma  o comprimidos. No es necesario tener Emergency planning/management officer. Se insertan en la vagina con un  aplicador antes de Management consultant. El proceso debe repetirse cada vez que tiene relaciones sexuales. ANTICONCEPTIVOS INTRAUTERINOS   Dispositivo intrauterino (DIU). Es un dispositivo en forma de T que se coloca en el tero durante el perodo menstrual, para Location manager. Hay dos tipos:  DIU de cobre. Este tipo de DIU est recubierto con un alambre de cobre y se inserta dentro del tero. El cobre hace que el tero y las trompas de Falopio produzcan un liquido que [employer] espermatozoides. Puede permanecer colocado durante 10 aos.  DIU hormonal. Este tipo de DIU contiene la hormona progestina (progesterona sinttica). La hormona espesa el moco cervical y evita que los espermatozoides ingresen al tero y tambin afina la membrana que cubre el tero para evitar la implantacin del vulo fertilizado. La hormona debilita o destruye los espermatozoides que ingresan al tero. Puede permanecer colocado durante 5 aos. MTODOS ANTICONCEPTIVOS PERMANENTES   Ligadura de trompas en la mujer. La ligadura de trompas en la mujer se realiza sellando, atando u obstruyendo quirrgicamente las trompas de Falop[place] lo que impide que el vulo descienda hacia el tero.  Esterilizacin masculina. Se realiza atando los conductos por los que pasan los espermatozoides (vasectoma).Esto impide que el esperma ingrese a la vagina durante el acto sexual. Luego del procedimiento, el hombre puede eyacular lquido (semen). MTODOS DE PLANIFICACIN NATURAL   Planificacin familiar natural.  Consiste en no tener relaciones sexuales o usar un mtodo de barrera (condn, [place], capuchn cervical) en los [employer] la mujer podra quedar [place].  Mtodo calendario.  Consiste en el seguimiento de la duracin de cada ciclo menstrual y la identificacin de los perodos frtiles.  Mtodo de Occupational hygienist.  Consiste en evitar las relaciones sexuales durante la ovulacin.  Mtodo sintotrmico. Paramedic  las relaciones sexuales en la poca en la que se est ovulando, utilizando un termmetro y tendiendo en cuenta los sntomas de la ovulacin.  Mtodo post-ovulacin. Consiste en planificar las relaciones sexuales para despus de haber ovulado. Independientemente del tipo o mtodo anticonceptivo que usted elija, es importante que use condones para protegerse contra las enfermedades de transmisin sexual (ETS). Hable con su mdico con respecto a qu mtodo anticonceptivo es el ms apropiado para usted.  Document Released: 02/02/2005 Document Revised: 04/27/2011 [facility] Patient Information 2013 [place], [place].

## 2011-11-24 LAB — WET PREP, GENITAL
Clue Cells Wet Prep HPF POC: NONE SEEN
Trich, Wet Prep: NONE SEEN
Yeast Wet Prep HPF POC: NONE SEEN

## 2013-11-02 ENCOUNTER — Other Ambulatory Visit (HOSPITAL_COMMUNITY): Payer: Self-pay | Admitting: Physician Assistant

## 2013-11-02 DIAGNOSIS — Z3689 Encounter for other specified antenatal screening: Secondary | ICD-10-CM

## 2013-11-02 LAB — OB RESULTS CONSOLE ABO/RH: RH Type: POSITIVE

## 2013-11-02 LAB — OB RESULTS CONSOLE HIV ANTIBODY (ROUTINE TESTING): HIV: NONREACTIVE

## 2013-11-02 LAB — OB RESULTS CONSOLE GC/CHLAMYDIA
CHLAMYDIA, DNA PROBE: NEGATIVE
Gonorrhea: NEGATIVE

## 2013-11-02 LAB — OB RESULTS CONSOLE RPR: RPR: NONREACTIVE

## 2013-11-02 LAB — OB RESULTS CONSOLE RUBELLA ANTIBODY, IGM: Rubella: IMMUNE

## 2013-11-02 LAB — OB RESULTS CONSOLE HEPATITIS B SURFACE ANTIGEN: HEP B S AG: NEGATIVE

## 2013-11-02 LAB — OB RESULTS CONSOLE ANTIBODY SCREEN: Antibody Screen: NEGATIVE

## 2013-11-07 ENCOUNTER — Ambulatory Visit (HOSPITAL_COMMUNITY)
Admission: RE | Admit: 2013-11-07 | Discharge: 2013-11-07 | Disposition: A | Discharge status: Home / Self Care | Payer: Medicaid Other | Source: Ambulatory Visit | Attending: Physician Assistant | Admitting: Physician Assistant

## 2013-11-07 DIAGNOSIS — Z3689 Encounter for other specified antenatal screening: Secondary | ICD-10-CM | POA: Diagnosis not present

## 2013-12-18 ENCOUNTER — Encounter: Payer: Self-pay | Admitting: Family Medicine

## 2014-02-15 LAB — OB RESULTS CONSOLE GBS: GBS: NEGATIVE

## 2014-02-16 NOTE — L&D Delivery Note (Signed)
Delivery Note At 7:07 PM a viable female was delivered via Vaginal, Spontaneous Delivery (Presentation: Right Occiput Anterior). Loose nuchal x2 and body x1. APGAR: 8, 9; weight pending .   Placenta status: intact.  Cord: 3 vessels, approximately 40 inches with the following complications: None.  Cord pH: na  Anesthesia: None  Episiotomy: None Lacerations: 1st degree, superficial perineal Suture Repair: na Est. Blood Loss (mL):  150 ml  Mom to postpartum.  Baby to Couplet care / Skin to Skin.  Rolm BookbinderMoss, Witney Huie 03/15/2014, 7:26 PM

## 2014-03-14 ENCOUNTER — Encounter (HOSPITAL_COMMUNITY): Payer: Self-pay

## 2014-03-14 ENCOUNTER — Inpatient Hospital Stay (HOSPITAL_COMMUNITY): Payer: Medicaid Other

## 2014-03-14 ENCOUNTER — Other Ambulatory Visit (HOSPITAL_COMMUNITY): Payer: Self-pay | Admitting: Nurse Practitioner

## 2014-03-14 ENCOUNTER — Inpatient Hospital Stay (HOSPITAL_COMMUNITY)
Admission: AD | Admit: 2014-03-14 | Discharge: 2014-03-17 | DRG: 775 | Disposition: A | Discharge status: Home / Self Care | Payer: Medicaid Other | Source: Ambulatory Visit | Attending: Obstetrics & Gynecology | Admitting: Obstetrics & Gynecology

## 2014-03-14 DIAGNOSIS — Z3A40 40 weeks gestation of pregnancy: Secondary | ICD-10-CM | POA: Diagnosis present

## 2014-03-14 DIAGNOSIS — O288 Other abnormal findings on antenatal screening of mother: Secondary | ICD-10-CM | POA: Diagnosis present

## 2014-03-14 DIAGNOSIS — O480 Post-term pregnancy: Secondary | ICD-10-CM

## 2014-03-14 LAB — CBC
HEMATOCRIT: 35.9 % — AB (ref 36.0–46.0)
HEMOGLOBIN: 12 g/dL (ref 12.0–15.0)
MCH: 27.3 pg (ref 26.0–34.0)
MCHC: 33.4 g/dL (ref 30.0–36.0)
MCV: 81.8 fL (ref 78.0–100.0)
PLATELETS: 199 10*3/uL (ref 150–400)
RBC: 4.39 MIL/uL (ref 3.87–5.11)
RDW: 15.7 % — ABNORMAL HIGH (ref 11.5–15.5)
WBC: 9.6 10*3/uL (ref 4.0–10.5)

## 2014-03-14 LAB — TYPE AND SCREEN
ABO/RH(D): O POS
Antibody Screen: NEGATIVE

## 2014-03-14 MED ORDER — OXYTOCIN BOLUS FROM INFUSION: 500.0000 mL | INTRAVENOUS | Status: DC

## 2014-03-14 MED ORDER — CITRIC ACID-SODIUM CITRATE 334-500 MG/5ML PO SOLN: 30.0000 mL | ORAL | Status: DC | PRN

## 2014-03-14 MED ORDER — ONDANSETRON HCL 4 MG/2ML IJ SOLN: 4.0000 mg | Freq: Four times a day (QID) | INTRAMUSCULAR | Status: DC | PRN

## 2014-03-14 MED ORDER — OXYCODONE-ACETAMINOPHEN 5-325 MG PO TABS: 2.0000 | ORAL_TABLET | ORAL | Status: DC | PRN

## 2014-03-14 MED ORDER — LACTATED RINGERS IV SOLN: 500.0000 mL | INTRAVENOUS | Status: DC | PRN

## 2014-03-14 MED ORDER — OXYTOCIN 40 UNITS IN LACTATED RINGERS INFUSION - SIMPLE MED: 62.5000 mL/h | INTRAVENOUS | Status: DC

## 2014-03-14 MED ORDER — OXYCODONE-ACETAMINOPHEN 5-325 MG PO TABS: 1.0000 | ORAL_TABLET | ORAL | Status: DC | PRN

## 2014-03-14 MED ORDER — LACTATED RINGERS IV SOLN
INTRAVENOUS | Status: DC
  Administered 2014-03-14 – 2014-03-15 (×2): via INTRAVENOUS

## 2014-03-14 MED ORDER — LIDOCAINE HCL (PF) 1 % IJ SOLN
30.0000 mL | INTRAMUSCULAR | Status: DC | PRN
  Filled 2014-03-14: qty 30

## 2014-03-14 MED ORDER — ACETAMINOPHEN 325 MG PO TABS: 650.0000 mg | ORAL_TABLET | ORAL | Status: DC | PRN

## 2014-03-14 NOTE — MAU Note (Signed)
PT presents from the health department because of a non reactive NST. Denies vaginal bleeding, discharge or leaking of fluid. Reports good fetal movement.

## 2014-03-14 NOTE — Progress Notes (Signed)
Sent from HD for NRNST, BPP 8/8 now in MAU.  NST: reassuring, 10x10 accels but no 15x15 accels.  Perry MountACOSTA,Lindsay Canale ROCIO, MD 7:22 PM

## 2014-03-14 NOTE — MAU Note (Signed)
Pt in room. Ambulated to bathroom. Placed on FHR monitor

## 2014-03-14 NOTE — Progress Notes (Signed)
Called to have dr review strip because of variable decelerations. Will watch for 30 more minutes and if no decelerations can discharge home

## 2014-03-14 NOTE — H&P (Signed)
Lindsay Tyler is a 29 y.o. female G3P2002 @[redacted]w[redacted]d  via US on 11/07/13 presenting for non-reactive NST. Patient was seen for a normal prenatal visit at the health department. NST was performed and was found to be non-reactive, patient was then told to report to the MAU. BPP was performed in the MAU and was 8/8. NST repeated and was reactive w/ 10x10 acceleration (no 15x15s). Later in the MAU some variable decelerations were appreciated. Decelerations did not persist but admission for labor induction was deemed appropriate 2/2 some continued concerns with variability in a term-pregnancy.   History OB History    Gravida Para Term Preterm AB TAB SAB Ectopic Multiple Living   3 2 2  0 0 0 0 0 0 2     Past Medical History  Diagnosis Date  . No pertinent past medical history    Past Surgical History  Procedure Laterality Date  . No past surgeries     Family History: family history includes Cancer in her mother; Diabetes in her mother; Heart disease in her mother; Hypertension in her mother. Social History:  reports that she has never smoked. She has never used smokeless tobacco. She reports that she does not drink alcohol or use illicit drugs.   Prenatal Transfer Tool  Maternal Diabetes: No Genetic Screening: Normal Maternal Ultrasounds/Referrals: Normal Fetal Ultrasounds or other Referrals:  None Maternal Substance Abuse:  No Significant Maternal Medications:  None Significant Maternal Lab Results:  Lab values include: Group B Strep negative Other Comments:  None  ROS    Blood pressure 112/58, pulse 82, temperature 98.5 F (36.9 C), temperature source Oral, resp. rate 20, height 5' 1.02" (1.55 m), weight 92.987 kg (205 lb), currently breastfeeding. Exam Physical Exam  General -- oriented x3, pleasant and cooperative. Spanish speaking. HEENT -- Head is normocephalic. PERRLA. EOMI. Integument -- intact. No rash, erythema, or ecchymoses.  Chest -- good expansion. Lungs clear to  auscultation. Cardiac -- RRR. No murmurs noted.  Abdomen -- soft, nontender. Appropriate for gestation age CNS -- cranial nerves II through XII grossly intact. Extremeties - no tenderness or effusions noted. ROM good. 5/5 bilateral strength. Dorsalis pedis pulses present and symmetrical.   Prenatal labs: ABO, Rh: O/Positive/-- (09/17 0000) Antibody: Negative (09/17 0000) Rubella: Immune (09/17 0000) RPR: Nonreactive (09/17 0000)  HBsAg: Negative (09/17 0000)  HIV: Non-reactive (09/17 0000)  GBS: Negative (12/31 0000)   Assessment/Plan: Assessment: 1. Labor: induction 2/2 non-reassuring FHT 2. Fetal Wellbeing: Category II  3. Pain Control: Patient does not desire an epidural at this time. Will provide IV fentanyl as needed. 4. GBS: neg 5. 40.0 week IUP  Plan:  1. Admit to BS per consult with MD 2. Routine L&D orders 3. Analgesia/anesthesia PRN     Kathee DeltonMcKeag, Ian D 03/14/2014, 9:55 PM   I have seen and examined this patient and I agree with the above. Cx 2/50/post. Foley bulb inserted without difficulty and inflated w/ 60cc fluid. Cam HaiSHAW, Kassandra Meriweather CNM 12:23 AM 03/15/2014

## 2014-03-14 NOTE — Progress Notes (Signed)
Lindsay Tyler is a 29 y.o. G3P2002 at 4749w0d by ultrasound admitted for induction of labor due to Non-reactive NST.  Subjective: Patient doing well. Foley Bulb placed. Patient reported immediate increase in cramping feeling. No issues or questions at this time.   Objective: BP 112/58 mmHg  Pulse 82  Temp(Src) 98.5 F (36.9 C) (Oral)  Resp 20  Ht 5' 1.02" (1.55 m)  Wt 92.987 kg (205 lb)  BMI 38.70 kg/m2      FHT:  FHR: 135 bpm, variability: moderate,  accelerations:  Present,  decelerations:  Absent UC:   Irregular  SVE:   Dilation: 2 Effacement (%): Thick Station: -2 Exam by:: ansah-mensah, rnc   Labs: Lab Results  Component Value Date   WBC 9.6 03/14/2014   HGB 12.0 03/14/2014   HCT 35.9* 03/14/2014   MCV 81.8 03/14/2014   PLT 199 03/14/2014    Assessment / Plan: Induction of labor 2/2 non-reactive NST; foley bulb in place @ 23:45  Labor: Progressing normally Preeclampsia:  n/a Fetal Wellbeing:  Category I Pain Control:  Labor support without medications I/Tyler:  GBS neg Anticipated MOD:  NSVD  Lindsay Tyler, Lindsay Tyler 03/14/2014, 11:53 PM

## 2014-03-14 NOTE — Progress Notes (Signed)
Called back regarding  FHR tracing. Will admit

## 2014-03-14 NOTE — Progress Notes (Signed)
Requested provider to review strip

## 2014-03-15 ENCOUNTER — Ambulatory Visit (HOSPITAL_COMMUNITY): Admission: RE | Admit: 2014-03-15 | Payer: Medicaid Other | Source: Ambulatory Visit

## 2014-03-15 ENCOUNTER — Encounter (HOSPITAL_COMMUNITY): Payer: Self-pay | Admitting: *Deleted

## 2014-03-15 DIAGNOSIS — O480 Post-term pregnancy: Secondary | ICD-10-CM | POA: Insufficient documentation

## 2014-03-15 DIAGNOSIS — Z3A40 40 weeks gestation of pregnancy: Secondary | ICD-10-CM | POA: Insufficient documentation

## 2014-03-15 MED ORDER — TETANUS-DIPHTH-ACELL PERTUSSIS 5-2.5-18.5 LF-MCG/0.5 IM SUSP: 0.5000 mL | Freq: Once | INTRAMUSCULAR | Status: DC

## 2014-03-15 MED ORDER — ONDANSETRON HCL 4 MG/2ML IJ SOLN: 4.0000 mg | INTRAMUSCULAR | Status: DC | PRN

## 2014-03-15 MED ORDER — TERBUTALINE SULFATE 1 MG/ML IJ SOLN
0.2500 mg | Freq: Once | INTRAMUSCULAR | Status: DC | PRN
  Filled 2014-03-15: qty 1

## 2014-03-15 MED ORDER — SENNOSIDES-DOCUSATE SODIUM 8.6-50 MG PO TABS
2.0000 | ORAL_TABLET | ORAL | Status: DC
  Administered 2014-03-15 – 2014-03-16 (×2): 2 via ORAL
  Filled 2014-03-15 (×2): qty 2

## 2014-03-15 MED ORDER — PRENATAL MULTIVITAMIN CH
1.0000 | ORAL_TABLET | Freq: Every day | ORAL | Status: DC
  Administered 2014-03-16 – 2014-03-17 (×2): 1 via ORAL
  Filled 2014-03-15 (×2): qty 1

## 2014-03-15 MED ORDER — IBUPROFEN 600 MG PO TABS
600.0000 mg | ORAL_TABLET | Freq: Four times a day (QID) | ORAL | Status: DC
  Administered 2014-03-15 – 2014-03-17 (×7): 600 mg via ORAL
  Filled 2014-03-15 (×8): qty 1

## 2014-03-15 MED ORDER — FENTANYL CITRATE 0.05 MG/ML IJ SOLN
100.0000 ug | INTRAMUSCULAR | Status: DC | PRN
  Administered 2014-03-15: 100 ug via INTRAVENOUS
  Filled 2014-03-15: qty 2

## 2014-03-15 MED ORDER — LANOLIN HYDROUS EX OINT: TOPICAL_OINTMENT | CUTANEOUS | Status: DC | PRN

## 2014-03-15 MED ORDER — OXYTOCIN 40 UNITS IN LACTATED RINGERS INFUSION - SIMPLE MED
1.0000 m[IU]/min | INTRAVENOUS | Status: DC
  Administered 2014-03-15: 2 m[IU]/min via INTRAVENOUS
  Filled 2014-03-15: qty 1000

## 2014-03-15 MED ORDER — DIBUCAINE 1 % RE OINT: 1.0000 "application " | TOPICAL_OINTMENT | RECTAL | Status: DC | PRN

## 2014-03-15 MED ORDER — ONDANSETRON HCL 4 MG PO TABS: 4.0000 mg | ORAL_TABLET | ORAL | Status: DC | PRN

## 2014-03-15 MED ORDER — BENZOCAINE-MENTHOL 20-0.5 % EX AERO: 1.0000 "application " | INHALATION_SPRAY | CUTANEOUS | Status: DC | PRN

## 2014-03-15 MED ORDER — FENTANYL CITRATE 0.05 MG/ML IJ SOLN
100.0000 ug | Freq: Once | INTRAMUSCULAR | Status: AC
  Administered 2014-03-15: 100 ug via INTRAVENOUS

## 2014-03-15 MED ORDER — DIPHENHYDRAMINE HCL 25 MG PO CAPS: 25.0000 mg | ORAL_CAPSULE | Freq: Four times a day (QID) | ORAL | Status: DC | PRN

## 2014-03-15 MED ORDER — SIMETHICONE 80 MG PO CHEW: 80.0000 mg | CHEWABLE_TABLET | ORAL | Status: DC | PRN

## 2014-03-15 MED ORDER — WITCH HAZEL-GLYCERIN EX PADS: 1.0000 "application " | MEDICATED_PAD | CUTANEOUS | Status: DC | PRN

## 2014-03-15 MED ORDER — FENTANYL CITRATE 0.05 MG/ML IJ SOLN
INTRAMUSCULAR | Status: AC
  Administered 2014-03-15: 100 ug via INTRAVENOUS
  Filled 2014-03-15: qty 2

## 2014-03-15 MED ORDER — ZOLPIDEM TARTRATE 5 MG PO TABS: 5.0000 mg | ORAL_TABLET | Freq: Every evening | ORAL | Status: DC | PRN

## 2014-03-15 NOTE — Progress Notes (Signed)
I stopped by to check on patient's needs.  Eda H Royal Interpreter. °

## 2014-03-15 NOTE — Progress Notes (Signed)
Sandi Mariscalva Majano-Melendez is a 29 y.o. G3P2002 at 1968w1d induction of labor due to NRNST.  Subjective: Pt c/o more pressure.  Objective: BP 98/68 mmHg  Pulse 83  Temp(Src) 98.2 F (36.8 C) (Oral)  Resp 20  Ht 5' 1.02" (1.55 m)  Wt 92.987 kg (205 lb)  BMI 38.70 kg/m2      FHT:  FHR: 140 bpm, variability: moderate,  accelerations:  Present,  decelerations:  Present single UC:   regular, every 2-3 minutes SVE:   Dilation: 7.5 Effacement (%): 90 Station: -2 Exam by:: williams cnm  Labs: Lab Results  Component Value Date   WBC 9.6 03/14/2014   HGB 12.0 03/14/2014   HCT 35.9* 03/14/2014   MCV 81.8 03/14/2014   PLT 199 03/14/2014    Assessment / Plan: Induction of labor due to NRNST,  progressing well on pitocin  Labor: progressing as expected. expectant management. Preeclampsia:  no signs or symptoms of toxicity Fetal Wellbeing:  Category I Pain Control:  Fentanyl I/D:  gbs neg Anticipated MOD:  NSVD  Rolm BookbinderMoss, Adiana Smelcer 03/15/2014, 5:19 PM

## 2014-03-15 NOTE — Progress Notes (Signed)
Lindsay Tyler is a 29 y.o. G3P2002 at [redacted]w[redacted]d admitted for induction of labor due to NRST with decels.  Subjective: Foley bulb out @ 0530. Pt feeling well, recently finished breakfast.  Objective: BP 79/48 mmHg  Pulse 78  Temp(Src) 98.7 F (37.1 C) (Oral)  Resp 20  Ht 5' 1.02" (1.55 m)  Wt 92.987 kg (205 lb)  BMI 38.70 kg/m2      FHT:  FHR: 150 bpm, variability: minimal ,  accelerations:  Present,  decelerations:  Present early  UC:   irregular, every 6 minutes SVE:   Dilation: 4 Effacement (%): 60 Station: -3 Exam by:: Raliegh Ipatherine Stout RN/ Dr. Rachelle HoraMoss  Labs: Lab Results  Component Value Date   WBC 9.6 03/14/2014   HGB 12.0 03/14/2014   HCT 35.9* 03/14/2014   MCV 81.8 03/14/2014   PLT 199 03/14/2014    Assessment / Plan: Patient is 29 y.o. [id number] 314w1d presents for IOL for NRNST with decels. Foley bulb out @ 0530. Starting pitocin.  Labor: IOL for NRNST. Foley bulb out @ 0530. Starting pitocin. Will increase pitocin as needed. Preeclampsia:  no signs or symptoms of toxicity Pain Control:  Fentanyl, planning I/D:  GBS neg Anticipated MOD:  NSVD   Interpreter at bedside Rolm BookbinderMoss, Day Greb 03/15/2014, 8:56 AM

## 2014-03-15 NOTE — Lactation Note (Signed)
This note was copied from the chart of Lindsay Tyler. Lactation Consultation Note  Patient Name: Lindsay Tyler ZOXWR'UToday's Date: 03/15/2014 Reason for consult: Initial assessment of this mom and baby at 3 hours pp.  This is mom's second child.  Her 29 yo breastfed for 6 months and she denies latching difficulty with her newborn.  LC encouraged frequent STS and cue feedings.  FOB present and encouraging. LC able to speak Spanish and parents verbalize understanding.  LC provided [employer] Resource brochure in Spanish, and reviewed WH services and list of community and web site resources, especially [facility] website which has information available in [place].Vladimir Crofts.LC encouraged review of Baby and Me pp 13-16 for review of BF information in [place].   Maternal Data Formula Feeding for Exclusion: No Has patient been taught Hand Expression?:  (experienced breastfeeding mom) Does the patient have breastfeeding experience prior to this delivery?: Yes  Feeding Feeding Type: Breast Fed Length of feed: 10 min  LATCH Score/Interventions Latch: Grasps breast easily, tongue down, lips flanged, rhythmical sucking.  Audible Swallowing: Spontaneous and intermittent  Type of Nipple: Everted at rest and after stimulation  Comfort (Breast/Nipple): Soft / non-tender     Hold (Positioning): No assistance needed to correctly position infant at breast.  LATCH Score: 10  Lactation Tools Discussed/Used WIC Program: Yes STS and cue feedings  Consult Status Consult Status: Follow-up Date: 03/16/14 Follow-up type: In-patient    Warrick ParisianBryant, Kwynn Schlotter [facility] 03/15/2014, 10:54 PM

## 2014-03-15 NOTE — Progress Notes (Signed)
I assisted Dr Wende MottMckeag with some instructionsand explanation about place a  foley , and Catarina HartshornKimberly CNM, by Orlan LeavensViria Alvarez, Spanish Interpreter

## 2014-03-15 NOTE — Progress Notes (Signed)
Lindsay Tyler is a 29 y.o. G3P2002 at 5238w1d induction of labor due to NRNST.  Subjective: Pt feeling more pressure and contractions more painful. Objective: BP 112/61 mmHg  Pulse 81  Temp(Src) 98.4 F (36.9 C) (Oral)  Resp 20  Ht 5' 1.02" (1.55 m)  Wt 92.987 kg (205 lb)  BMI 38.70 kg/m2      FHT:  FHR: 150 bpm, variability: minimal ,  accelerations:  Present,  decelerations:  Present early UC:  Reg every 3 minutes SVE:   Dilation: 5.5 Effacement (%): 70 Station: -2 Exam by:: Santina Evansatherine stout RN/ Dr. Rachelle HoraMoss  Labs: Lab Results  Component Value Date   WBC 9.6 03/14/2014   HGB 12.0 03/14/2014   HCT 35.9* 03/14/2014   MCV 81.8 03/14/2014   PLT 199 03/14/2014    Assessment / Plan: Lindsay Tyler is a 29 y.o. G3P2002 at 3438w1d induction of labor due to NRNST.  Labor: Progressing on pitocin Preeclampsia:  no signs or symptoms of toxicity Fetal Wellbeing:  Category I Pain Control:  IV pain meds prn I/D:  gbs neg Anticipated MOD:  NSVD  Rolm BookbinderMoss, Kaytlynne Neace 03/15/2014, 3:08 PM

## 2014-03-15 NOTE — Progress Notes (Signed)
Patient ID: Lindsay Tyler, female   DOB: Aug 15, 1985, 29 y.o.   MRN: [id number]  Blood pressure 104/32, pulse 116, temperature 98.7 F (37.1 C), temperature source Oral, resp. rate 20, height 5' 1.02" (1.55 m), weight 92.987 kg (205 lb), currently breastfeeding.  Doing well. Not hurting with contractions. Dilation: 4 Effacement (%): Thick Station: -3 Presentation: Vertex Exam by:: clemmons cnm Fhr- non reactive no decels. Cat 2 UC- q 4 minutes ; mild to mod  A: Ind for NRFHR     IUP @ 40+1 P: AROM- blood tinged; clear small amount      Continue pitocin      Anticipate SVD

## 2014-03-15 NOTE — Progress Notes (Signed)
I assisted Dr Rachelle HoraMoss with explanation of care plan. Lindsay Tyler  Interpreter.

## 2014-03-15 NOTE — Progress Notes (Addendum)
Lindsay Tyler is a 29 y.o. G3P2002 at 4246w1d by ultrasound admitted for induction of labor due to Non-reactive NST.  Subjective: Foley bulb out this AM at ~530. Patient tolerating induction well. Will likely begin Pitocin shortly.  Objective: BP 85/66 mmHg  Pulse 84  Temp(Src) 98.3 F (36.8 C) (Oral)  Resp 16  Ht 5' 1.02" (1.55 m)  Wt 92.987 kg (205 lb)  BMI 38.70 kg/m2      FHT:  FHR: 135 bpm, variability: minimal ,  accelerations:  Present,  decelerations:  Absent UC:   irregular SVE:   Dilation: 2 Effacement (%): 50 Station: -2 Exam by:: Lindsay Tyler, cnm  Labs: Lab Results  Component Value Date   WBC 9.6 03/14/2014   HGB 12.0 03/14/2014   HCT 35.9* 03/14/2014   MCV 81.8 03/14/2014   PLT 199 03/14/2014    Assessment / Plan: Induction of labor due to non-reassuring fetal testing,  progressing well on pitocin  Labor: Progressing normally Preeclampsia:  n/a Fetal Wellbeing:  Category I Pain Control:  Labor support without medications I/D:  GBS neg Anticipated MOD:  NSVD  Kathee DeltonMcKeag, Orin Eberwein D 03/15/2014, 5:57 AM

## 2014-03-16 NOTE — Progress Notes (Signed)
I ordered the patient's lunch. Lindsay Tyler  Interpreter.

## 2014-03-16 NOTE — Progress Notes (Signed)
I stopped by to check on patients need, by Orlan LeavensViria Alvarez Spanish Interpreter

## 2014-03-16 NOTE — Progress Notes (Signed)
I stopped by to check on patient's needs.  Eda H Royal Interpreter. °

## 2014-03-16 NOTE — Progress Notes (Signed)
Ur chart review completed.  

## 2014-03-17 DIAGNOSIS — Z3A40 40 weeks gestation of pregnancy: Secondary | ICD-10-CM

## 2014-03-17 MED ORDER — IBUPROFEN 600 MG PO TABS: 600.0000 mg | ORAL_TABLET | Freq: Four times a day (QID) | ORAL | Status: AC | PRN

## 2014-03-17 NOTE — Progress Notes (Signed)
I stopped by patients room to check on her needs, by Viria Alvarez Spanish - Interpreter °

## 2014-03-17 NOTE — Discharge Instructions (Signed)
Cuidados en el postparto luego de un parto vaginal  (Postpartum Care After Vaginal Delivery) Despus del parto (perodo de postparto), la estada normal en el hospital es de 24-72 horas. Si hubo problemas con el trabajo de parto o el parto, o si tiene otros problemas mdicos, es posible que Patent attorney en el hospital por ms [place].  Mientras est en el hospital, recibir [place] e instrucciones sobre cmo cuidar de usted misma y de su beb recin nacido durante el postparto.  Mientras est en el hospital:   Asegrese de decirle a las enfermeras si siente dolor o Tree surgeon, as como donde Designer, television/film set y Architect.  Si usted tuvo una incisin cerca de la vagina (episiotoma) o si ha tenido Education officer, museum, las enfermeras le pondrn hielo sobre la episiotoma o Psychiatrist. Las bolsas de hielo pueden ayudar a Dietitian y la hinchazn.  Si est amamantando, puede sentir contracciones dolorosas en el tero durante algunas semanas. Esto es normal. Las contracciones ayudan a que el tero vuelva a su tamao normal.  Es normal tener algo de sangrado despus del Placedo.  Durante los primeros 1-3 das despus del parto, el flujo es de color rojo y la cantidad puede ser similar a un perodo.  Es frecuente que el flujo se inicie y se Production assistant, radio.  En los primeros [place], puede eliminar algunos cogulos pequeos. Informe a las enfermeras si elimina cogulos grandes o aumenta el flujo.  No  elimine los cogulos de sangre por el inodoro antes de que la [employer] vea.  Durante los prximos 3 a [address] despus del parto, el flujo debe ser ms acuoso y rosado o Forensic psychologist.  Chancy Hurter a catorce [employer] del parto, el flujo debe ser una pequea cantidad de secrecin de color blanco amarillento.  La cantidad de flujo disminuir en las primeras semanas despus del parto. El flujo puede detenerse en 6-8 semanas. La mayora de las mujeres no tienen ms flujo a las 12 semanas  despus del [place].  Usted debe cambiar sus apsitos con frecuencia.  Lvese bien las manos con agua y jabn durante al menos 20 segundos despus de cambiar el apsito, usar el bao o antes de Nature conservation officer o Research scientist (life sciences) a su recin nacido.  Usted podr sentir como que tiene que vaciar la vejiga durante las primeras 6-8 horas despus del [place].  En caso de que sienta debilidad, mareo o [place], llame a la enfermera antes de levantarse de la cama por primera vez y antes de tomar una ducha por primera vez.  Dentro de los [employer] del parto, sus mamas pueden comenzar a estar sensibles y [place]. Esto se llama congestin. La sensibilidad en los senos por lo general desaparece dentro de las 48-72 horas despus de que ocurre la congestin. Tambin puede notar que la [place] se escapa de sus senos. Si no est amamantando no estimule sus pechos. La estimulacin de las mamas hace que sus senos produzcan ms [place].  Pasar tanto tiempo como le sea posible con el beb recin nacido es muy importante. Durante ese tiempo, usted y su beb deben sentirse cerca y conocerse uno al otro. Tener al beb en su habitacin (alojamiento conjunto) ayudar a fortalecer el vnculo con el beb recin nacido.Esto le dar tiempo para conocerlo y atenderlo de [place] cmoda.  Las hormonas se modifican despus del parto. A veces, los cambios hormonales pueden causar tristeza o ganas de llorar por un tiempo. Estos sentimientos  no deben durar ms de Hughes Supplyunos pocos das. Si duran ms que eso, debe hablar con su mdico.  Si lo desea, hable con su mdico acerca de los mtodos de planificacin familiar o mtodos anticonceptivos.  Hable con su mdico acerca de las vacunas. El mdico puede indicarle que se aplique las siguientes vacunas antes de salir del hospital:  [place] and PrincipeVacuna contra el ttanos, la difteria y la tos ferina (Tdap) o el ttanos y la difteria (Td). Es muy importante que usted y su familia (incluyendo a los abuelos) u otras  personas que cuidan al recin nacido estn al da con las vacunas Tdap o Td. Las vacunas Tdap o Td pueden ayudar a proteger al recin nacido de enfermedades.  Inmunizacin contra la rubola.  Inmunizacin contra la varicela.  Inmunizacin contra la gripe. Usted debe recibir esta vacunacin anual si no la ha recibido Academic librariandurante el embarazo. Document Released: 11/30/2006 Document Revised: 10/28/2011 [facility] LLCExitCare Patient Information 2015 HumeExitCare, MarylandLLC. This information is not intended to replace advice given to you by your health care provider. Make sure you discuss any questions you have with your health care provider.  Lactancia materna (Breastfeeding) Decidir Museum/gallery exhibitions officeramamantar es una de las mejores elecciones que puede hacer por usted y su beb. El cambio hormonal durante el Psychiatristembarazo produce el desarrollo del tejido mamario y Lesothoaumenta la cantidad y el tamao de los conductos galactforos. Estas hormonas tambin permiten que las protenas, los azcares y las grasas de la sangre produzcan la WPS Resourcesleche materna en las glndulas productoras de Harrisonleche. Las hormonas impiden que la leche materna sea liberada antes del nacimiento del beb, adems de impulsar el flujo de leche luego del nacimiento. Una vez que ha comenzado a Museum/gallery exhibitions officeramamantar, Conservation officer, naturepensar en el beb, as Immunologistcomo la succin o Theatre managerel llanto, pueden estimular la liberacin de Tremontleche de las glndulas productoras de Bennettleche.  LOS BENEFICIOS DE AMAMANTAR Para el beb  La primera leche (calostro) ayuda a Careers information officermejorar el funcionamiento del sistema digestivo del beb.  La leche tiene anticuerpos que ayudan a Radio producerprevenir las infecciones en el beb.  El beb tiene una menor incidencia de asma, alergias y del sndrome de muerte sbita del lactante.  Los nutrientes en la Weverleche materna son mejores para el beb que la Decaturleche maternizada y estn preparados exclusivamente para cubrir las necesidades del beb.  La leche materna mejora el desarrollo cerebral del beb.  Es menos probable que el beb  desarrolle otras enfermedades, como obesidad infantil, asma o diabetes mellitus de tipo 2. Para usted   La lactancia materna favorece el desarrollo de un vnculo muy especial entre la madre y el beb.  Es conveniente. La leche materna siempre est disponible a la Human resources officertemperatura correcta y es Port Alswortheconmica.  La lactancia materna ayuda a quemar caloras y a perder el peso ganado durante el Vanlueembarazo.  Favorece la contraccin del tero al tamao que tena antes del embarazo de manera ms rpida y disminuye el sangrado (loquios) despus del parto.  La lactancia materna contribuye a reducir Nurse, adultel riesgo de desarrollar diabetes mellitus de tipo 2, osteoporosis o cncer de mama o de ovario en el futuro. SIGNOS DE QUE EL BEB EST HAMBRIENTO Primeros signos de [address]re  Aumenta su estado de Lesothoalerta o actividad.  Se estira.  Mueve la cabeza de un lado a otro.  Mueve la cabeza y abre la boca cuando se le toca la mejilla o la comisura de la boca (reflejo de bsqueda).  Aumenta las vocalizaciones, tales como sonidos de succin, se relame los labios,  emite arrullos, suspiros, o chirridos.  Mueve la Jones Apparel Groupmano hacia la boca.  Se chupa con ganas los dedos o las manos. Signos tardos de Fisher Scientifichambre  Est agitado.  Llora de manera intermitente. Signos de AES Corporationhambre extrema Los signos de hambre extrema requerirn que lo calme y lo consuele antes de que el beb pueda alimentarse adecuadamente. No espere a que se manifiesten los siguientes signos de hambre extrema para comenzar a Museum/gallery exhibitions officeramamantar:   Designer, jewelleryAgitacin.  Llanto intenso y fuerte.   Gritos. INFORMACIN BSICA SOBRE LA LACTANCIA MATERNA Iniciacin de la lactancia materna  Encuentre un lugar cmodo para sentarse o acostarse, con un buen respaldo para el cuello y la espalda.  Coloque una almohada o una manta enrollada debajo del beb para acomodarlo a la altura de la mama (si est sentada). Las almohadas para Museum/gallery exhibitions officeramamantar se han diseado especialmente a fin de servir de apoyo  para los brazos y el beb Smithfield Foodsmientras amamanta.  Asegrese de que el abdomen del beb est frente al suyo.  Masajee suavemente la mama. Con las yemas de los dedos, masajee la pared del pecho hacia el pezn en un movimiento circular. Esto estimula el flujo de Crucibleleche. Es posible que Engineer, manufacturing systemsdeba continuar este movimiento mientras amamanta si la leche fluye lentamente.  Sostenga la mama con el pulgar por arriba del pezn y los otros 4 dedos por debajo de la mama. Asegrese de que los dedos se encuentren lejos del pezn y de la boca del beb.  Empuje suavemente los labios del beb con el pezn o con el dedo.  Cuando la boca del beb se abra lo suficiente, acrquelo rpidamente a la mama e introduzca todo el pezn y la zona oscura que lo rodea (areola), tanto como sea posible, dentro de la boca del beb.  Debe haber ms areola visible por arriba del labio superior del beb que por debajo del labio inferior.  La lengua del beb debe estar entre la enca inferior y la Texicomama.  Asegrese de que la boca del beb est en la posicin correcta alrededor del pezn (prendida). Los labios del beb deben crear un sello sobre la mama y estar doblados hacia afuera (invertidos).  Es comn que el beb succione durante 2 a 3 minutos para que comience el flujo de Landingvilleleche materna. Cmo debe prenderse Es muy importante que le ensee al beb cmo prenderse adecuadamente a la mama. Si el beb no se prende adecuadamente, puede causarle dolor en el pezn y reducir la produccin de Bakers[place] Country Clubleche materna, y hacer que el beb tenga un escaso aumento de [place]o. Adems, si el beb no se prende adecuadamente al pezn, puede tragar aire durante la alimentacin. Esto puede causarle molestias al beb. Hacer eructar al beb al Pilar Platecambiar de mama puede ayudarlo a liberar el aire. Sin embargo, ensearle al beb cmo prenderse a la mama adecuadamente es la mejor manera de evitar que se sienta molesto por tragar Oceanographeraire mientras se alimenta. Signos de que el beb se  ha prendido adecuadamente al pezn:   Payton Doughtyironea o succiona de modo silencioso, sin causarle dolor.  Se escucha que traga cada 3 o 4 succiones.   Hay movimientos musculares por arriba y por delante de sus odos al Printmakersuccionar. Signos de que el beb no se ha prendido Audiological scientistadecuadamente al pezn:   Hace ruidos de succin o de chasquido mientras se alimenta.  Siente dolor en el pezn. Si cree que el beb no se prendi correctamente, deslice el dedo en la comisura de la boca y colquelo  entre las encas del beb para interrumpir la succin. Intente comenzar a amamantar nuevamente. Signos de Fish farm managerlactancia materna exitosa Signos del beb:   Disminuye gradualmente el nmero de succiones o cesa la succin por completo.  Se duerme.  Relaja el cuerpo.  Retiene una pequea cantidad de Kindred Healthcareleche en la boca.  Se desprende solo del pecho. Signos que presenta usted:  Las mamas han aumentado la firmeza, el peso y el tamao 1 a 3 horas despus de Museum/gallery exhibitions officeramamantar.  Estn ms blandas inmediatamente despus de amamantar.  Un aumento del volumen de McIntyreleche, y tambin un cambio en su consistencia y color se producen hacia el quinto da de Tour managerlactancia materna.  Los pezones no duelen, ni estn agrietados ni sangran. Signos de que su beb recibe la cantidad de leche suficiente  Moja al menos 3 paales en 24 horas. La orina debe ser clara y de color amarillo plido a los 5 [address]  [address] de [place]vida.  Defeca al menos 3 veces en 24 horas a los [address]  [address] de [address]ida. La materia fecal debe ser blanda y Pajarito Mesaamarillenta.  Defeca al menos 3 veces en 24 horas a los [address] das de [address]. La materia fecal debe ser grumosa y Hato Candalamarillenta.  No registra una prdida de peso mayor del 10% del peso al nacer durante los primeros 3 [address]  [address]das de [place]vida.  Aumenta de peso un promedio de 4 a 7onzas (113 a 198g) por semana despus de los 4 [address]  [address] de vida.  Aumenta de Bradfordpeso, Flushingdiariamente, de Hazardmanera uniforme a Glass blower/designerpartir de los 5 [address]  [address] de vida, sin Passenger transport managerregistrar prdida de peso despus de las  2semanas de vida. Despus de alimentarse, es posible que el beb regurgite una pequea cantidad. Esto es frecuente. FRECUENCIA Y DURACIN DE LA LACTANCIA MATERNA El amamantamiento frecuente la ayudar a producir ms Azerbaijanleche y a Education officer, communityprevenir problemas de Engineer, miningdolor en los pezones e hinchazn en las Town Linemamas. Alimente al beb cuando muestre signos de hambre o si siente la necesidad de reducir la congestin de las [place] Hillmamas. Esto se denomina "lactancia a demanda". Evite el uso del chupete mientras trabaja para establecer la lactancia (las primeras 4 a 6 semanas despus del nacimiento del beb). Despus de este perodo, podr ofrecerle un chupete. Las investigaciones demostraron que el uso del chupete durante el primer ao de vida del beb disminuye el riesgo de desarrollar el sndrome de muerte sbita del lactante (SMSL). Permita que el nio se alimente en cada mama todo lo que desee. Contine amamantando al beb hasta que haya terminado de alimentarse. Cuando el beb se desprende o se queda dormido mientras se est alimentando de la primera mama, ofrzcale la segunda. Debido a que, con frecuencia, los recin Sunoconacidos permanecen somnolientos las primeras semanas de vida, es posible que deba despertar al beb para alimentarlo. Los horarios de Acupuncturistlactancia varan de un beb a otro. Sin embargo, las siguientes reglas pueden servir como gua para ayudarla a Lawyergarantizar que el beb se alimenta adecuadamente:  Se puede amamantar a los recin nacidos (bebs de 4 semanas o menos de vida) cada 1 a 3 horas.  No deben transcurrir ms de 3 horas durante el da o 5 horas durante la noche sin que se amamante a los recin nacidos.  Debe amamantar al beb 8 veces como mnimo en un perodo de 24 horas, hasta que comience a introducir slidos en su dieta, a los 6 meses de vida aproximadamente. EXTRACCIN DE Dean Foods CompanyLECHE MATERNA La extraccin y Contractorel almacenamiento de la leche materna le permiten asegurarse de que el  beb se alimente exclusivamente de  Colgate Palmoliveleche materna, aun en momentos en los que no puede amamantar. Esto tiene especial importancia si debe regresar al Aleen Campitrabajo en el perodo en que an est amamantando o si no puede estar presente en los momentos en que el beb debe alimentarse. Su asesor en lactancia puede orientarla sobre cunto tiempo es seguro almacenar Port Alexanderleche materna.  El sacaleche es un aparato que le permite extraer leche de la mama a un recipiente estril. Luego, la leche materna extrada puede almacenarse en un refrigerador o Electrical engineercongelador. Algunos sacaleches son Birdie Riddlemanuales, Delaney Meigsmientras que otros son elctricos. Consulte a su asesor en lactancia qu tipo ser ms conveniente para usted. Los sacaleches se pueden comprar; sin embargo, algunos hospitales y grupos de apoyo a la lactancia materna alquilan Sports coachsacaleches mensualmente. Un asesor en lactancia puede ensearle cmo extraer W. R. Berkleyleche materna manualmente, en caso de que prefiera no usar un sacaleche.  CMO CUIDAR LAS MAMAS DURANTE LA LACTANCIA MATERNA Los pezones se secan, agrietan y duelen durante la Tour managerlactancia materna. Las siguientes recomendaciones pueden ayudarla a Pharmacologistmantener las TEPPCO Partnersmamas humectadas y sanas:  Careers information officervite usar jabn en los pezones.  Use un sostn de soporte. Aunque no son esenciales, las camisetas sin mangas o los sostenes especiales para Museum/gallery exhibitions officeramamantar estn diseados para acceder fcilmente a las mamas, para Museum/gallery exhibitions officeramamantar sin tener que quitarse todo el sostn o la camiseta. Evite usar sostenes con aro o sostenes muy ajustados.  Seque al aire sus pezones durante 3 a 4minutos despus de amamantar al beb.  Utilice solo apsitos de Haematologistalgodn en el sostn para Environmental health practitionerabsorber las prdidas de Hudsonleche. La prdida de un poco de Public Service Enterprise Groupleche materna entre las tomas es normal.  Utilice lanolina sobre los pezones luego de Museum/gallery exhibitions officeramamantar. La lanolina ayuda a mantener la humedad normal de la piel. Si Botswanausa lanolina pura, no tiene que lavarse los pezones antes de volver a Corporate treasureralimentar al beb. La lanolina pura no es txica para el  beb. Adems, puede extraer Beazer Homesmanualmente algunas gotas de Cape Royaleleche materna y Engineer, maintenance (IT)masajear suavemente esa Winn-Dixieleche sobre los pezones, para que la Terltonleche se seque al aire. Durante las primeras semanas despus de dar a luz, algunas mujeres pueden experimentar hinchazn en las mamas (congestin Strandburgmamaria). La congestin puede hacer que sienta las mamas pesadas, calientes y sensibles al tacto. El pico de la congestin ocurre dentro de los 3 a 5 das despus del Hillsboroughparto. Las siguientes recomendaciones pueden ayudarla a Paramedicaliviar la congestin:  Vace por completo las mamas al QUALCOMMamamantar o Environmental health practitionerextraer leche. Puede aplicar calor hmedo en las mamas (en la ducha o con toallas hmedas para manos) antes de Museum/gallery exhibitions officeramamantar o extraer WPS Resourcesleche. Esto aumenta la circulacin y Saint Vincent and the Grenadinesayuda a que la Holyroodleche fluya. Si el beb no vaca por completo las [address]amas cuando lo [address] Aveamamanta, extraiga la Judsonialeche restante despus de que haya finalizado.  Use un sostn ajustado (para amamantar o comn) o una camiseta sin mangas durante 1 o 2 das para indicar al cuerpo que disminuya ligeramente la produccin de Olneyleche.  Aplique compresas de hielo Yahoo! Incsobre las mamas, a menos que le resulte demasiado incmodo.  Asegrese de que el beb est prendido y se encuentre en la posicin correcta mientras lo alimenta. Si la congestin persiste luego de 48 horas o despus de seguir estas recomendaciones, comunquese con su mdico o un Holiday representativeasesor en lactancia. RECOMENDACIONES GENERALES PARA EL CUIDADO DE LA SALUD DURANTE LA LACTANCIA MATERNA  Consuma alimentos saludables. Alterne comidas y colaciones, y coma 3 de cada una por da. Dado que lo que come  afecta la leche materna, es posible que algunas comidas hagan que su bebé se vuelva más irritable de lo habitual. Evite comer este tipo de alimentos si percibe que afectan de manera negativa al bebé. °· Beba leche, jugos de fruta y agua para satisfacer su sed (aproximadamente 10 vasos al día). °· Descanse con frecuencia, relájese y tome sus vitaminas  prenatales para evitar la fatiga, el estrés y la anemia. °· Continúe con los autocontroles de la mama. °· Evite masticar y fumar tabaco. °· Evite el consumo de alcohol y drogas. °Algunos medicamentos, que pueden ser perjudiciales para el bebé, pueden pasar a través de la leche materna. Es importante que consulte a su médico antes de tomar cualquier medicamento, incluidos todos los medicamentos recetados y de venta libre, así como los suplementos vitamínicos y herbales. °Puede quedar embarazada durante la lactancia. Si desea controlar la natalidad, consulte a su médico cuáles son las opciones más seguras para el bebé. °SOLICITE ATENCIÓN MÉDICA SI:  °· Usted siente que quiere dejar de amamantar o se siente frustrada con la lactancia. °· Siente dolor en las mamas o en los pezones. °· Sus pezones están agrietados o sangran. °· Sus pechos están irritados, sensibles o calientes. °· Tiene un área hinchada en cualquiera de las mamas. °· Siente escalofríos o fiebre. °· Tiene náuseas o vómitos. °· Presenta una secreción de otro líquido distinto de la leche materna de los pezones. °· Sus mamas no se llenan antes de amamantar al bebé para el quinto día después del parto. °· Se siente triste y deprimida. °· El bebé está demasiado somnoliento como para comer bien. °· El bebé tiene problemas para dormir. °· Moja menos de 3 pañales en 24 horas. °· Defeca menos de 3 veces en 24 horas. °· La piel del bebé o la parte blanca de los ojos se vuelven amarillentas. °· El bebé no ha aumentado de peso a los 5 días de vida. °SOLICITE ATENCIÓN MÉDICA DE INMEDIATO SI:  °· El bebé está muy cansado (letargo) y no se quiere despertar para comer. °· Le sube la fiebre sin causa. °Document Released: 02/02/2005 Document Revised: 02/07/2013 °ExitCare® Patient Information ©2015 ExitCare, LLC. This information is not intended to replace advice given to you by your health care provider. Make sure you discuss any questions you have with your health care  provider. ° °

## 2014-03-17 NOTE — Discharge Summary (Signed)
Obstetric Discharge Summary Reason for Admission: IOL d/t NRNST @ 40.0wks Prenatal Procedures: NST and ultrasound Intrapartum Procedures: spontaneous vaginal delivery Postpartum Procedures: none Complications-Operative and Postpartum: 1st degree perineal laceration w/o repair Eating, drinking, voiding, ambulating well.  +flatus.  Lochia and pain wnl.  Denies dizziness, lightheadedness, or sob. No complaints.   Hospital Course: Lindsay Tyler is a 29 y.o. [id number] female admited at 40.0wks for IOL d/t non-reactive NST. She received a cervical foley bulb then pitocin and progressed to NSVB. Her pp course has been uncomplicated.  By PPD#2 she is doing well and is deemed to have received the full benefit of her hospital stay.  Filed Vitals:   03/17/14 0500  BP: 95/51  Pulse: 66  Temp: 98.1 F (36.7 C)  Resp: 19   H/H: Lab Results  Component Value Date/Time   HGB 12.0 03/14/2014 10:15 PM   HCT 35.9* 03/14/2014 10:15 PM    Physical Exam: General: alert, cooperative and no distress Abdomen/Uterine Fundus: Appropriately tender, non-distended, FF @ U-2 Incision: n/a Lochia: appropriate Extremities: No evidence of DVT seen on physical exam. Negative Homan's sign, no cords, calf tenderness, or significant calf/ankle edema   Discharge Diagnoses: Term Pregnancy-delivered  Discharge Information: Date: 08/28/2010 Activity: pelvic rest Diet: routine  Medications: PNV and Ibuprofen Breast feeding: Yes Contraception: depo, abstinence until then Circumcision: n/a Condition: stable Instructions: refer to handout Discharge to: home  Infant: Home with mother pending d/c from peds  Follow-up Information    [facility] [place]   Why:  4-6 weeks for your postpartum visit   Contact information:   [address] [place] [id number] [phone]      Lindsay Tyler, Lindsay Tyler, CNM, WHNP-BC 03/17/2014,10:45 AM

## 2014-03-17 NOTE — Lactation Note (Signed)
This note was copied from the chart of Lindsay Tyler. Lactation Consultation Note  [employer] Interpreter # [phone] Mikle BosworthCarlos used. Mother unlatched baby upon entering the room.  Baby asleep. Mother states her nipples are sore a little.  No cracks or bleeding.  Provided mother w/ comfort gels. Encouraged mother to breastfeed first before she give formula to establish her milk supply. Reviewed supply and demand.  Mother denies other questions.   Patient Name: Lindsay Tyler YQIHK'VToday's Date: 03/17/2014 Reason for consult: Follow-up assessment   Maternal Data    Feeding Feeding Type: Breast Fed Length of feed: 15 min  LATCH Score/Interventions                      Lactation Tools Discussed/Used     Consult Status Consult Status: Follow-up Date: 03/18/14 Follow-up type: In-patient    Dahlia ByesBerkelhammer, Ruth [facility] Ltd[facility]os[facility] 03/17/2014, 12:01 PM

## 2014-03-18 ENCOUNTER — Ambulatory Visit: Payer: Self-pay

## 2014-03-18 NOTE — Lactation Note (Signed)
This note was copied from the chart of Lindsay Tyler. Lactation Consultation Note  In house interpreter present. Discussed engorgement care and monitoring voids/stools. Mother denies problems or questions.  Patient Name: Lindsay Tyler ZOXWR'UToday's Date: 03/18/2014 Reason for consult: Follow-up assessment   Maternal Data    Feeding    LATCH Score/Interventions                      Lactation Tools Discussed/Used     Consult Status Consult Status: Complete    Hardie PulleyBerkelhammer, Jaquelynn Wanamaker Boschen 03/18/2014, 10:40 AM

## 2014-03-19 LAB — HIV ANTIBODY (ROUTINE TESTING W REFLEX): HIV SCREEN 4TH GENERATION: NONREACTIVE

## 2014-03-19 LAB — RPR: RPR Ser Ql: NONREACTIVE

## 2014-03-19 NOTE — Progress Notes (Signed)
Checked on patients needs.  Ordered patients meals Spanish Interpreter

## 2014-03-24 ENCOUNTER — Inpatient Hospital Stay (HOSPITAL_COMMUNITY): Admission: RE | Admit: 2014-03-24 | Payer: Medicaid Other | Source: Ambulatory Visit

## 2015-02-09 IMAGING — US US OB COMP +14 WK
1 series · 12 of 28 positions shown · non-contrast
Comparison: none

[Series 1: us ob comp +14 wk mfm · 12 of 83 slices shown]
[im 4/83]
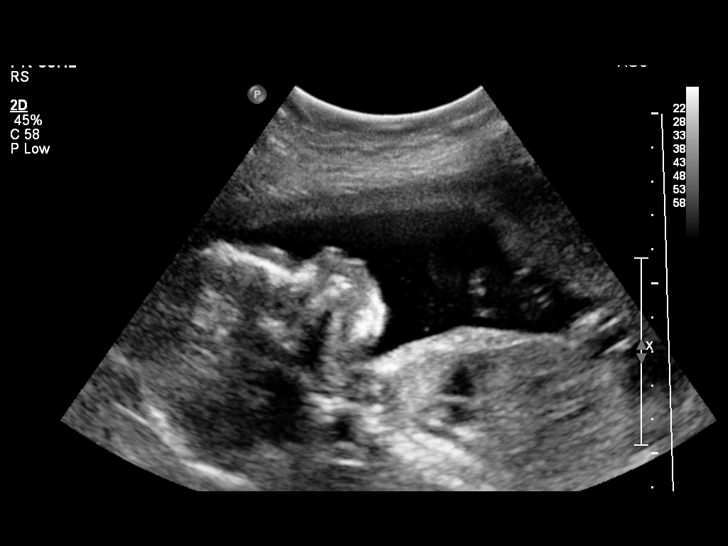
[im 10/83]
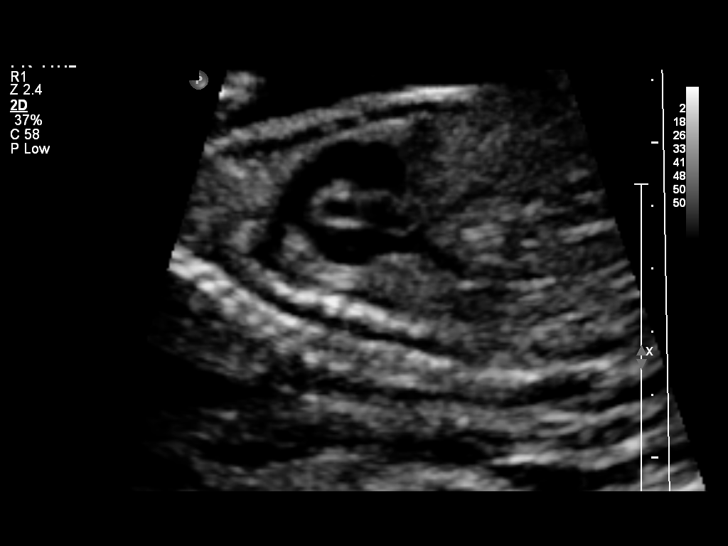
[im 16/83]
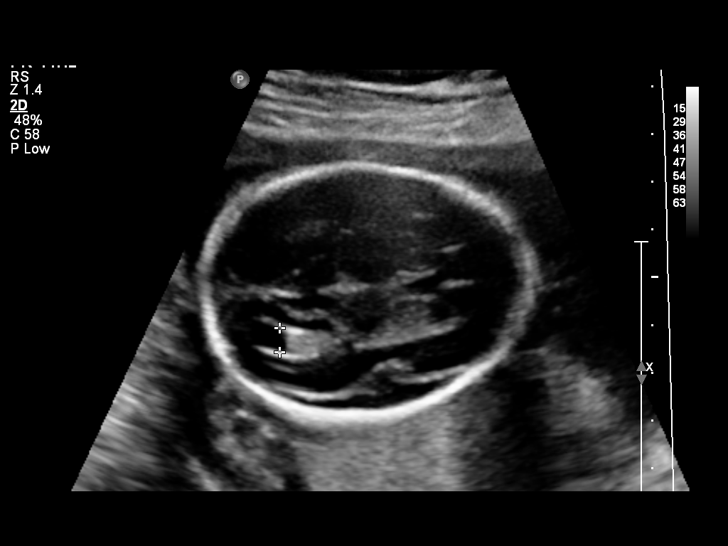
[im 25/83]
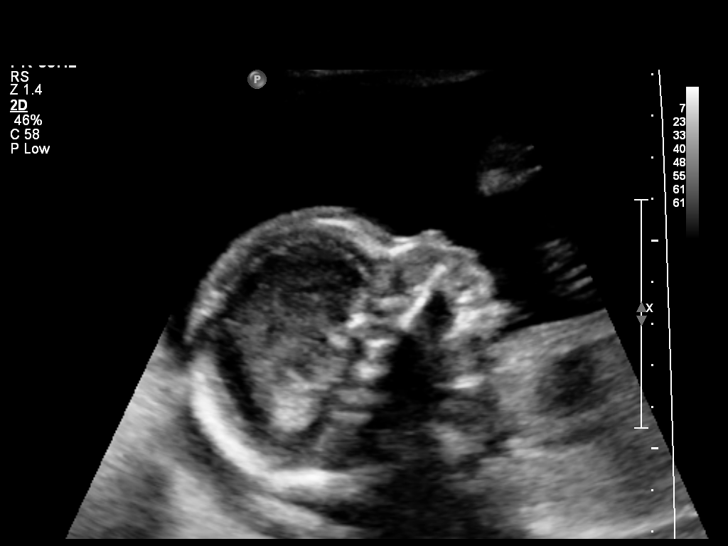
[im 31/83]
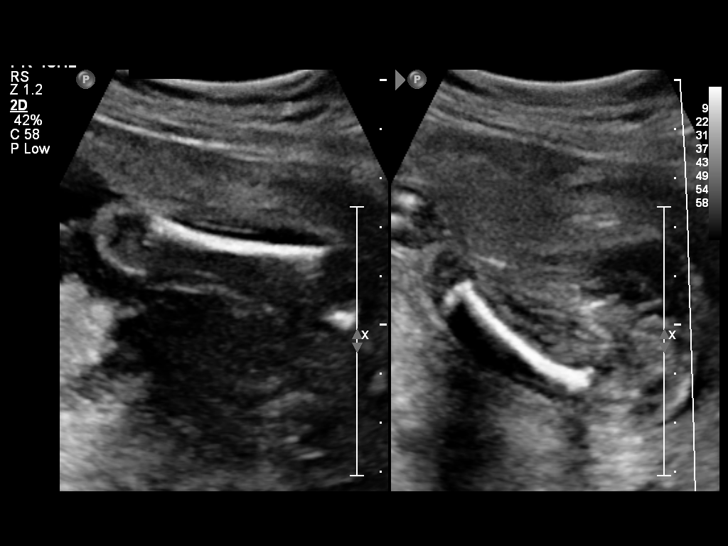
[im 37/83]
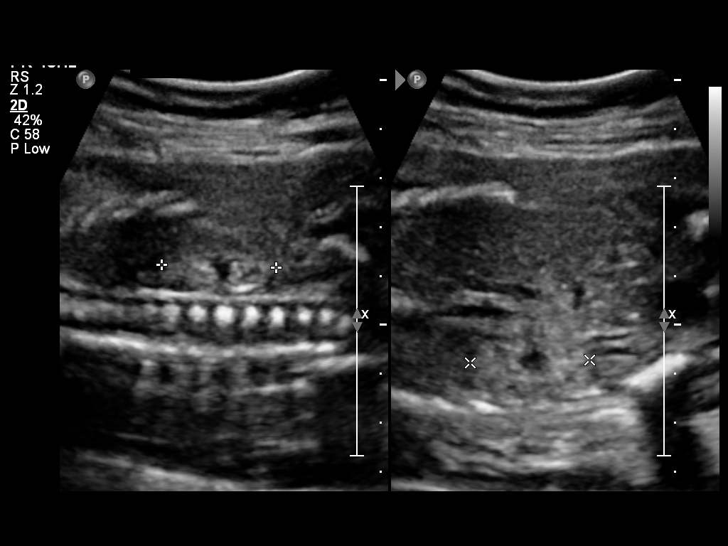
[im 46/83]
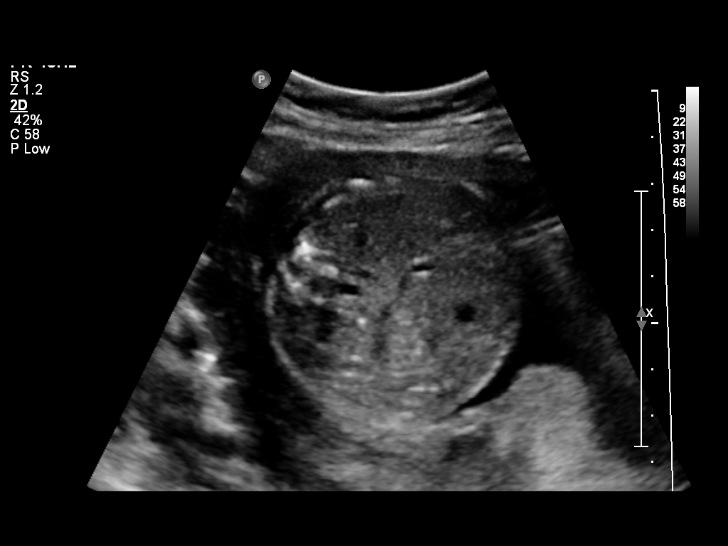
[im 52/83]
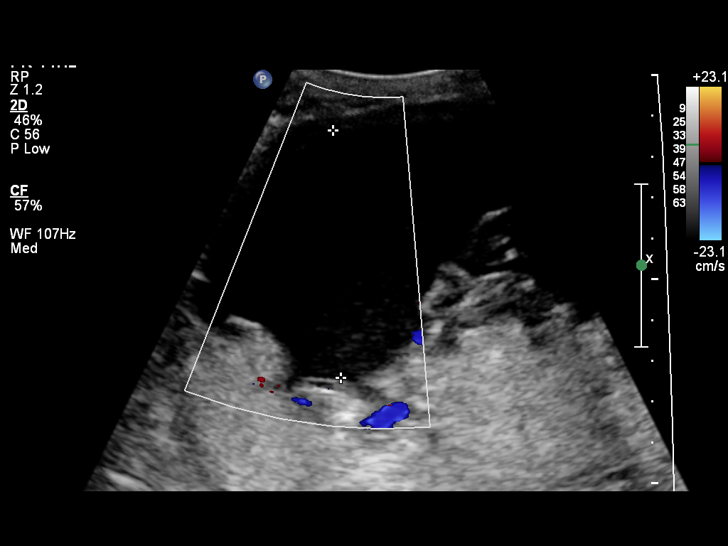
[im 58/83]
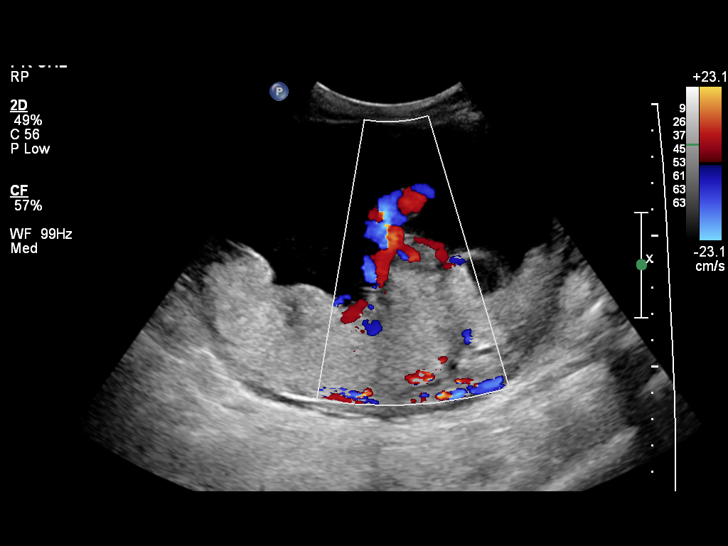
[im 67/83]
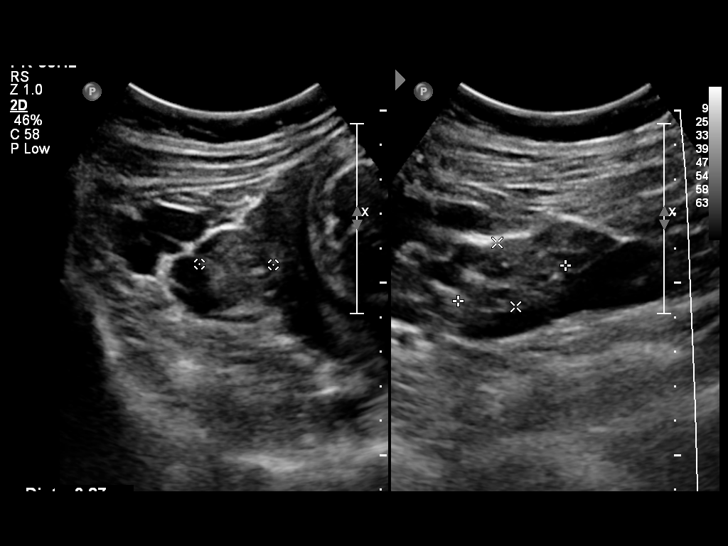
[im 73/83]
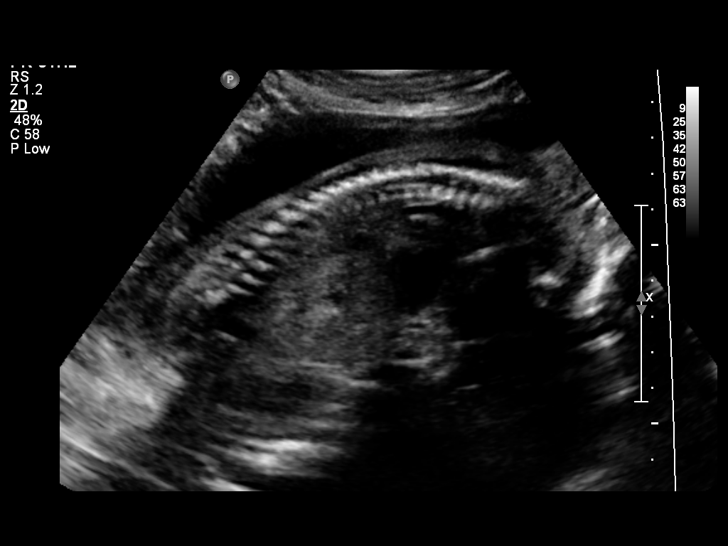
[im 79/83]
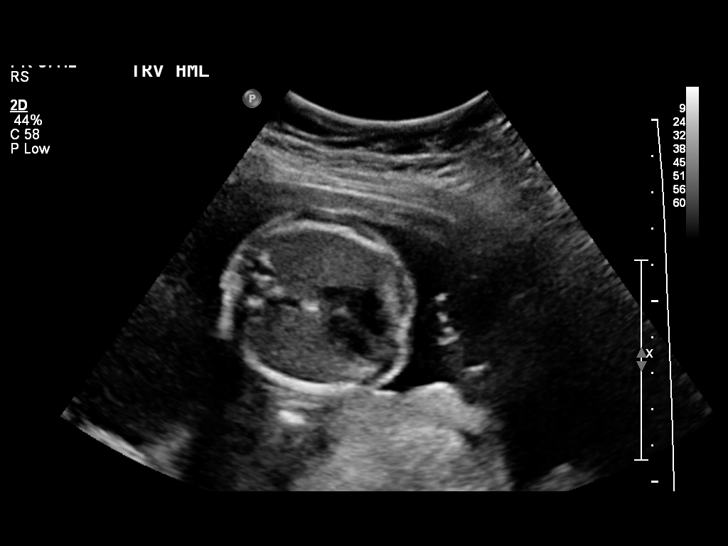

[12 of 28 positions shown; findings below may reference images not displayed]

OBSTETRICS REPORT
                      (Signed Final 11/07/2013 [DATE])

Service(s) Provided

 US OB COMP + 14 WK                                    76805.1
Indications

 Basic anatomic survey
Fetal Evaluation

 Num Of Fetuses:    1
 Fetal Heart Rate:  146                          bpm
 Cardiac Activity:  Observed
 Presentation:      Variable
 Placenta:          Posterior, above cervical
                    os
 P. Cord            Visualized, central
 Insertion:

 Amniotic Fluid
 AFI FV:      Subjectively within normal limits
                                             Larg Pckt:    6.03  cm
Biometry

 BPD:     51.5  mm     G. Age:  21w 5d                CI:        67.86   70 - 86
                                                      FL/HC:      17.7   18.4 -

 HC:       200  mm     G. Age:  22w 1d       51  %    HC/AC:      1.13   1.06 -

 AC:     176.6  mm     G. Age:  22w 4d       65  %    FL/BPD:     68.7   71 - 87
 FL:      35.4  mm     G. Age:  21w 1d       20  %    FL/AC:      20.0   20 - 24
 HUM:     34.6  mm     G. Age:  21w 6d       49  %
 CER:     22.1  mm     G. Age:  21w 0d       25  %

 Est. FW:     464  gm           1 lb     48  %
Gestational Age

 LMP:           26w 1d        Date:  05/08/13                 EDD:   02/12/14
 U/S Today:     21w 6d                                        EDD:   03/14/14
 Best:          21w 6d     Det. By:  U/S (11/07/13)           EDD:   03/14/14
Anatomy

 Cranium:          Appears normal         Aortic Arch:      Appears normal
 Fetal Cavum:      Appears normal         Ductal Arch:      Appears normal
 Ventricles:       Appears normal         Diaphragm:        Appears normal
 Choroid Plexus:   Appears normal         Stomach:          Appears normal, left
                                                            sided
 Cerebellum:       Appears normal         Abdomen:          Appears normal
 Posterior Fossa:  Appears normal         Abdominal Wall:   Appears nml (cord
                                                            insert, abd wall)
 Nuchal Fold:      Appears normal         Cord Vessels:     Appears normal (3
                                                            vessel cord)
 Face:             Appears normal         Kidneys:          Appear normal
                   (orbits and profile)
 Lips:             Appears normal         Bladder:          Appears normal
 Heart:            Appears normal         Spine:            Appears normal
                   (4CH, axis, and
                   situs)
 RVOT:             Appears normal         Lower             Appears normal
                                          Extremities:
 LVOT:             Appears normal         Upper             Appears normal
                                          Extremities:

 Other:  Fetus appears to be a female. Heels and 5th digit visualized. Nasal
         bone visualized.
Targeted Anatomy

 Fetal Central Nervous System
 Cisterna Magna:
Cervix Uterus Adnexa

 Cervical Length:    3.7      cm

 Cervix:       Normal appearance by transabdominal scan.
 Uterus:       No abnormality visualized.
 Cul De Sac:   No free fluid seen.
 Left Ovary:    Within normal limits.
 Right Ovary:   Within normal limits.

 Adnexa:     No abnormality visualized.
Impression

 SIUP at 21+6 weeks
 Normal detailed fetal anatomy
 Markers of aneuploidy: none
 Normal amniotic fluid volume
 EDC based on today's measurements
Recommendations

 Follow-up as clinically indicated
